# Patient Record
Sex: Female | Born: 1935 | Race: White | Hispanic: No | Marital: Married | Smoking: Never smoker
Health system: Southern US, Community
[De-identification: ages and names within clinical notes are randomized; demographics above are authoritative.]

## PROBLEM LIST (undated history)

## (undated) DIAGNOSIS — E119 Type 2 diabetes mellitus without complications: Secondary | ICD-10-CM

## (undated) DIAGNOSIS — F329 Major depressive disorder, single episode, unspecified: Secondary | ICD-10-CM

## (undated) DIAGNOSIS — Z794 Long term (current) use of insulin: Secondary | ICD-10-CM

## (undated) DIAGNOSIS — N289 Disorder of kidney and ureter, unspecified: Secondary | ICD-10-CM

## (undated) DIAGNOSIS — F32A Depression, unspecified: Secondary | ICD-10-CM

## (undated) DIAGNOSIS — G4730 Sleep apnea, unspecified: Secondary | ICD-10-CM

## (undated) DIAGNOSIS — E785 Hyperlipidemia, unspecified: Secondary | ICD-10-CM

## (undated) DIAGNOSIS — I10 Essential (primary) hypertension: Secondary | ICD-10-CM

## (undated) DIAGNOSIS — C801 Malignant (primary) neoplasm, unspecified: Secondary | ICD-10-CM

## (undated) DIAGNOSIS — IMO0001 Reserved for inherently not codable concepts without codable children: Secondary | ICD-10-CM

## (undated) DIAGNOSIS — I4891 Unspecified atrial fibrillation: Secondary | ICD-10-CM

## (undated) DIAGNOSIS — IMO0002 Reserved for concepts with insufficient information to code with codable children: Secondary | ICD-10-CM

## (undated) HISTORY — DX: Hyperlipidemia, unspecified: E78.5

## (undated) HISTORY — DX: Reserved for inherently not codable concepts without codable children: IMO0001

## (undated) HISTORY — DX: Long term (current) use of insulin: Z79.4

## (undated) HISTORY — DX: Type 2 diabetes mellitus without complications: E11.9

## (undated) HISTORY — DX: Sleep apnea, unspecified: G47.30

## (undated) HISTORY — DX: Malignant (primary) neoplasm, unspecified: C80.1

## (undated) HISTORY — PX: ABDOMINAL HYSTERECTOMY: SHX81

## (undated) HISTORY — PX: JOINT REPLACEMENT: SHX530

## (undated) HISTORY — PX: TONSILLECTOMY: SUR1361

## (undated) HISTORY — DX: Essential (primary) hypertension: I10

## (undated) HISTORY — PX: APPENDECTOMY: SHX54

## (undated) HISTORY — DX: Unspecified atrial fibrillation: I48.91

## (undated) HISTORY — DX: Reserved for concepts with insufficient information to code with codable children: IMO0002

## (undated) HISTORY — DX: Depression, unspecified: F32.A

## (undated) HISTORY — DX: Disorder of kidney and ureter, unspecified: N28.9

## (undated) HISTORY — DX: Major depressive disorder, single episode, unspecified: F32.9

---

## 2005-06-23 HISTORY — PX: CHOLECYSTECTOMY: SHX55

## 2010-06-23 HISTORY — PX: INSERT / REPLACE / REMOVE PACEMAKER: SUR710

## 2010-06-23 HISTORY — PX: INCONTINENCE SURGERY: SHX676

## 2010-06-23 HISTORY — PX: LUNG LOBECTOMY: SHX167

## 2011-02-06 LAB — PULMONARY FUNCTION TEST

## 2011-02-12 DIAGNOSIS — I4891 Unspecified atrial fibrillation: Secondary | ICD-10-CM

## 2011-02-12 DIAGNOSIS — C801 Malignant (primary) neoplasm, unspecified: Secondary | ICD-10-CM | POA: Insufficient documentation

## 2011-02-12 DIAGNOSIS — F329 Major depressive disorder, single episode, unspecified: Secondary | ICD-10-CM

## 2011-02-12 DIAGNOSIS — N289 Disorder of kidney and ureter, unspecified: Secondary | ICD-10-CM | POA: Insufficient documentation

## 2011-02-12 DIAGNOSIS — I10 Essential (primary) hypertension: Secondary | ICD-10-CM

## 2011-02-12 DIAGNOSIS — G4730 Sleep apnea, unspecified: Secondary | ICD-10-CM | POA: Insufficient documentation

## 2011-02-12 DIAGNOSIS — IMO0002 Reserved for concepts with insufficient information to code with codable children: Secondary | ICD-10-CM

## 2011-02-13 ENCOUNTER — Institutional Professional Consult (permissible substitution) (INDEPENDENT_AMBULATORY_CARE_PROVIDER_SITE_OTHER): Payer: Medicare Other | Admitting: Surgery

## 2011-02-13 VITALS — BP 95/63 | HR 68 | Temp 96.9°F | Resp 16 | Ht 63.0 in | Wt 135.0 lb

## 2011-02-13 DIAGNOSIS — C3410 Malignant neoplasm of upper lobe, unspecified bronchus or lung: Secondary | ICD-10-CM

## 2011-02-15 ENCOUNTER — Encounter: Payer: Self-pay | Admitting: Surgery

## 2011-02-15 DIAGNOSIS — IMO0002 Reserved for concepts with insufficient information to code with codable children: Secondary | ICD-10-CM | POA: Insufficient documentation

## 2011-02-15 DIAGNOSIS — N816 Rectocele: Secondary | ICD-10-CM | POA: Insufficient documentation

## 2011-02-15 NOTE — Progress Notes (Signed)
PCP is No primary provider on file. Referring Provider is No ref. provider found  Chief Complaint  Patient presents with  . Lung Cancer    Consultation, Adenocarcinoma, primary right upper lobe of lung    HPI The patient is a 76 year old woman who was scheduled to undergo pelvic floor surgery for cystocele and rectocele by her urologist in [facility]. A preoperative chest x-ray showed a mass in the right upper lobe. Subsequently underwent a CT scan of the chest on 01/13/2011 which showed a 5.5 x 4.6 cm spiculated mass in the right apex. There was also a 1.9 x 1.4 cm right lower lobe mass and there was a 5.7 x 4.9 mm nodule in the left lower lobe. There is also a hypodense lesion within the liver and possible lucencies at T10 and T12 vertebrae. There was also a right thyroid nodule. She was referred to Dr. Dorothyann Gibbs in pulmonary medicine and underwent bronchoscopy with biopsies on 01/29/2011. This showed adenocarcinoma in the right upper lobe lung mass. She underwent a PET scan on 02/05/2011 which showed hypermetabolic activity within the right apical lung mass. There was also hypermetabolic activity within the right lower lobe lung mass with an SUV of 2.85. The low-density lesion in the left lobe of the liver had no hypermetabolic activity. It was felt this is most likely a cyst or hemangioma. There was no other hypermetabolic activity seen within the chest, abdomen or pelvis and none in the spine. An MRI of the brain did not show any evidence of metastatic disease.  Past Medical History  Diagnosis Date  . Depression   . IDDM (insulin dependent diabetes mellitus)   . Hypertension   . Osteoporosis   . Cancer     adenocarcinoma  lung  . Hyperlipidemia   . Renal insufficiency   . Sleep apnea     CPAP  . Atrial fibrillation     ABLATION AT [facility] 2000  . Ulcer     Gastric    Past Surgical History  Procedure Date  . Cholecystectomy 2007  . Appendectomy   . Abdominal hysterectomy     age  69  . Tonsillectomy   . Joint replacement     knee     History reviewed. No pertinent family history.  Social History History  Substance Use Topics  . Smoking status: Never Smoker   . Smokeless tobacco: Never Used  . Alcohol Use: No    Current Outpatient Prescriptions  Medication Sig Dispense Refill  . allopurinol (ZYLOPRIM) 100 MG tablet Take 100 mg by mouth daily.        Marland Kitchen atenolol (TENORMIN) 25 MG tablet Take 25 mg by mouth daily.        . calcium citrate-vitamin D (CITRACAL+D) 315-200 MG-UNIT per tablet Take 1 tablet by mouth 2 (two) times daily.        . colchicine 0.6 MG tablet Take 0.6 mg by mouth 2 (two) times daily.        . digoxin (LANOXIN) 0.125 MG tablet Take 125 mcg by mouth daily.        Marland Kitchen diltiazem (CARDIZEM CD) 360 MG 24 hr capsule Take 360 mg by mouth daily.        Marland Kitchen estradiol (ESTRACE) 1 MG tablet Take 1 mg by mouth daily.        . fentaNYL (DURAGESIC - DOSED MCG/HR) 12 MCG/HR Place 1 patch onto the skin every 3 (three) days.        Marland Kitchen  fentaNYL (DURAGESIC - DOSED MCG/HR) 25 MCG/HR Place 1 patch onto the skin every 3 (three) days.        Marland Kitchen HYDROcodone-acetaminophen (VICODIN) 5-500 MG per tablet Take 1 tablet by mouth 3 (three) times daily.        . Liraglutide (VICTOZA) 18 MG/3ML SOLN Inject into the skin.        Marland Kitchen losartan (COZAAR) 50 MG tablet Take 50 mg by mouth daily.        . metFORMIN (GLUCOPHAGE) 1000 MG tablet Take 1,000 mg by mouth 2 (two) times daily with a meal.        . nystatin (MYCOSTATIN) 100000 UNIT/ML suspension Take 500,000 Units by mouth 4 (four) times daily. 5MLS,swish and swallow       . ondansetron (ZOFRAN) 4 MG tablet Take 4 mg by mouth every 8 (eight) hours as needed.        . pravastatin (PRAVACHOL) 40 MG tablet Take 40 mg by mouth at bedtime.        . torsemide (DEMADEX) 10 MG tablet Take 10 mg by mouth daily.        Marland Kitchen warfarin (COUMADIN) 5 MG tablet Take 5 mg by mouth daily.        Marland Kitchen zolpidem (AMBIEN) 5 MG tablet Take 5 mg by mouth at  bedtime as needed.        . diphenoxylate-atropine (LOMOTIL) 2.5-0.025 MG per tablet Take 1 tablet by mouth 4 (four) times daily as needed.        . fluconazole (DIFLUCAN) 100 MG tablet Take 100 mg by mouth daily.          Allergies  Allergen Reactions  . Adenosine Monophosphate   . Amiodarone Hcl   . Ciprofloxacin Hcl     Renal Insufficiency  . Compazine     Muscle weakness  . Flagyl (Metronidazole Hcl) Itching  . Keflex (Cephalexin)   . Neosporin (Triple Antibiotic)   . Paxil   . Penicillins     MOUTH SORES  . Prinivil (Lisinopril)   . Reglan     MUSCLE WEAKNESS  . Sotalol Hcl Af   . Toprol Xl (Metoprolol Succinate)      Review of Systems  Constitutional: Positive for appetite change and unexpected weight change. Negative for fever, chills, diaphoresis, activity change and fatigue.  HENT: Negative.   Eyes: Negative.   Respiratory: Positive for shortness of breath.   Cardiovascular: Positive for palpitations. Negative for chest pain and leg swelling.  Gastrointestinal: Positive for diarrhea.  Genitourinary: Negative.   Musculoskeletal: Positive for arthralgias.  Skin: Negative.   Neurological: Positive for dizziness.  Hematological: Negative.   Psychiatric/Behavioral: Negative.      Physical Exam  Constitutional: She is oriented to person, place, and time. She appears well-developed and well-nourished.  HENT:  Head: Normocephalic and atraumatic.  Nose: Nose normal.  Mouth/Throat: Oropharynx is clear and moist.  Eyes: Conjunctivae and EOM are normal. Pupils are equal, round, and reactive to light. No scleral icterus.  Neck: Normal range of motion. Neck supple. No tracheal deviation present. No thyromegaly present.  Cardiovascular: Normal heart sounds and intact distal pulses.  Exam reveals no gallop and no friction rub.   No murmur heard.      Irregular rate and rhythm  Pulmonary/Chest: Effort normal and breath sounds normal. She exhibits no tenderness.    Abdominal: Soft. Bowel sounds are normal. She exhibits no distension and no mass. There is no tenderness.  Musculoskeletal: She exhibits no edema.  Lymphadenopathy:    She has no cervical adenopathy.  Neurological: She is alert and oriented to person, place, and time. She has normal strength. No cranial nerve deficit or sensory deficit.  Skin: Skin is warm and dry.  Psychiatric: She has a normal mood and affect. Her behavior is normal. Judgment and thought content normal.    PFT's show normal spirometry and normal lung volumes. FEV1 is 2.52 which is 125% of predicted.  Diffusion capacity is a little low but when corrected for Hgb of 12.3 it is 85% of predicted    Impression She has a right upper lobe adenocarcinoma as well as a smaller 2 x 1.2 cm right lower lobe peripheral mass that has hypermetabolic uptake on PET scan and is likely also a carcinoma. It is not clear if this is a metastatic lesion or a second primary lung cancer. I think the best treatment is to perform a right upper lobectomy and wedge resection of the right lower lobe lung mass.  Plan She will call our office to schedule surgery in the near future. She would like to wait until after the Labor Day holiday. I discussed the operative procedure with the patient and her family. We discussed alternatives, benefits, and risks including but not limited to bleeding, blood transfusion, infection, prolonged air leak, or pleural fistula, and the possibility that she may require adjuvant therapy depending on the intraoperative and pathologic findings. She understands all that and agrees to proceed. She will need to stop her Coumadin 1 week prior to surgery and use a Lovenox bridge.

## 2011-02-21 ENCOUNTER — Encounter (HOSPITAL_COMMUNITY)
Admission: RE | Admit: 2011-02-21 | Discharge: 2011-02-21 | Disposition: A | Discharge status: Home / Self Care | Payer: Medicare Other | Source: Ambulatory Visit | Attending: Surgery | Admitting: Surgery

## 2011-02-21 LAB — COMPREHENSIVE METABOLIC PANEL
AST: 14 U/L (ref 0–37)
Albumin: 3.4 g/dL — ABNORMAL LOW (ref 3.5–5.2)
Alkaline Phosphatase: 88 U/L (ref 39–117)
Chloride: 98 mEq/L (ref 96–112)
Potassium: 4.6 mEq/L (ref 3.5–5.1)
Total Bilirubin: 0.3 mg/dL (ref 0.3–1.2)
Total Protein: 6.7 g/dL (ref 6.0–8.3)

## 2011-02-21 LAB — CBC
MCH: 29 pg (ref 26.0–34.0)
MCV: 85.8 fL (ref 78.0–100.0)
Platelets: 327 10*3/uL (ref 150–400)
RDW: 13.3 % (ref 11.5–15.5)
WBC: 10.2 10*3/uL (ref 4.0–10.5)

## 2011-02-21 LAB — BLOOD GAS, ARTERIAL
Acid-base deficit: 0.8 mmol/L (ref 0.0–2.0)
FIO2: 0.21 %
Patient temperature: 98.6
TCO2: 24.4 mmol/L (ref 0–100)
pH, Arterial: 7.406 — ABNORMAL HIGH (ref 7.350–7.400)

## 2011-02-21 LAB — URINALYSIS, ROUTINE W REFLEX MICROSCOPIC
Bilirubin Urine: NEGATIVE
Glucose, UA: NEGATIVE mg/dL
Hgb urine dipstick: NEGATIVE
Ketones, ur: NEGATIVE mg/dL
pH: 5 (ref 5.0–8.0)

## 2011-02-21 LAB — URINE MICROSCOPIC-ADD ON

## 2011-02-21 LAB — SURGICAL PCR SCREEN: Staphylococcus aureus: POSITIVE — AB

## 2011-02-27 ENCOUNTER — Inpatient Hospital Stay (HOSPITAL_COMMUNITY)
Admission: RE | Admit: 2011-02-27 | Discharge: 2011-03-13 | DRG: 163 | Disposition: A | Discharge status: Rehabilitation Facility | Payer: Medicare Other | Source: Ambulatory Visit | Attending: Surgery | Admitting: Surgery

## 2011-02-27 ENCOUNTER — Other Ambulatory Visit: Payer: Self-pay | Admitting: Surgery

## 2011-02-27 ENCOUNTER — Ambulatory Visit (HOSPITAL_COMMUNITY)
Admission: RE | Admit: 2011-02-27 | Discharge: 2011-02-27 | Disposition: A | Discharge status: Home / Self Care | Payer: Medicare Other | Source: Ambulatory Visit | Attending: Surgery | Admitting: Surgery

## 2011-02-27 ENCOUNTER — Inpatient Hospital Stay (HOSPITAL_COMMUNITY): Payer: Medicare Other

## 2011-02-27 DIAGNOSIS — Z794 Long term (current) use of insulin: Secondary | ICD-10-CM

## 2011-02-27 DIAGNOSIS — I4891 Unspecified atrial fibrillation: Secondary | ICD-10-CM | POA: Diagnosis present

## 2011-02-27 DIAGNOSIS — IMO0002 Reserved for concepts with insufficient information to code with codable children: Secondary | ICD-10-CM

## 2011-02-27 DIAGNOSIS — E119 Type 2 diabetes mellitus without complications: Secondary | ICD-10-CM | POA: Diagnosis present

## 2011-02-27 DIAGNOSIS — J189 Pneumonia, unspecified organism: Secondary | ICD-10-CM | POA: Diagnosis not present

## 2011-02-27 DIAGNOSIS — Z888 Allergy status to other drugs, medicaments and biological substances status: Secondary | ICD-10-CM

## 2011-02-27 DIAGNOSIS — M810 Age-related osteoporosis without current pathological fracture: Secondary | ICD-10-CM | POA: Diagnosis present

## 2011-02-27 DIAGNOSIS — Z881 Allergy status to other antibiotic agents status: Secondary | ICD-10-CM

## 2011-02-27 DIAGNOSIS — E785 Hyperlipidemia, unspecified: Secondary | ICD-10-CM | POA: Diagnosis present

## 2011-02-27 DIAGNOSIS — C3430 Malignant neoplasm of lower lobe, unspecified bronchus or lung: Secondary | ICD-10-CM | POA: Diagnosis present

## 2011-02-27 DIAGNOSIS — R5381 Other malaise: Secondary | ICD-10-CM | POA: Diagnosis present

## 2011-02-27 DIAGNOSIS — E8779 Other fluid overload: Secondary | ICD-10-CM | POA: Diagnosis not present

## 2011-02-27 DIAGNOSIS — Z01812 Encounter for preprocedural laboratory examination: Secondary | ICD-10-CM

## 2011-02-27 DIAGNOSIS — F3289 Other specified depressive episodes: Secondary | ICD-10-CM | POA: Diagnosis present

## 2011-02-27 DIAGNOSIS — K219 Gastro-esophageal reflux disease without esophagitis: Secondary | ICD-10-CM | POA: Diagnosis present

## 2011-02-27 DIAGNOSIS — J9600 Acute respiratory failure, unspecified whether with hypoxia or hypercapnia: Secondary | ICD-10-CM | POA: Diagnosis not present

## 2011-02-27 DIAGNOSIS — D62 Acute posthemorrhagic anemia: Secondary | ICD-10-CM | POA: Diagnosis not present

## 2011-02-27 DIAGNOSIS — F329 Major depressive disorder, single episode, unspecified: Secondary | ICD-10-CM | POA: Diagnosis present

## 2011-02-27 DIAGNOSIS — I10 Essential (primary) hypertension: Secondary | ICD-10-CM | POA: Diagnosis present

## 2011-02-27 DIAGNOSIS — C3410 Malignant neoplasm of upper lobe, unspecified bronchus or lung: Secondary | ICD-10-CM

## 2011-02-27 DIAGNOSIS — Z7901 Long term (current) use of anticoagulants: Secondary | ICD-10-CM

## 2011-02-27 DIAGNOSIS — Z01811 Encounter for preprocedural respiratory examination: Secondary | ICD-10-CM

## 2011-02-27 DIAGNOSIS — Z880 Allergy status to penicillin: Secondary | ICD-10-CM

## 2011-02-27 DIAGNOSIS — G4733 Obstructive sleep apnea (adult) (pediatric): Secondary | ICD-10-CM | POA: Diagnosis present

## 2011-02-27 DIAGNOSIS — Z96659 Presence of unspecified artificial knee joint: Secondary | ICD-10-CM

## 2011-02-27 DIAGNOSIS — Z79899 Other long term (current) drug therapy: Secondary | ICD-10-CM

## 2011-02-27 DIAGNOSIS — R0902 Hypoxemia: Secondary | ICD-10-CM | POA: Diagnosis not present

## 2011-02-27 DIAGNOSIS — E876 Hypokalemia: Secondary | ICD-10-CM | POA: Diagnosis not present

## 2011-02-27 LAB — GLUCOSE, CAPILLARY
Glucose-Capillary: 216 mg/dL — ABNORMAL HIGH (ref 70–99)
Glucose-Capillary: 133 mg/dL — ABNORMAL HIGH (ref 70–99)

## 2011-02-28 ENCOUNTER — Inpatient Hospital Stay (HOSPITAL_COMMUNITY): Payer: Medicare Other

## 2011-02-28 LAB — GLUCOSE, CAPILLARY
Glucose-Capillary: 128 mg/dL — ABNORMAL HIGH (ref 70–99)
Glucose-Capillary: 177 mg/dL — ABNORMAL HIGH (ref 70–99)
Glucose-Capillary: 162 mg/dL — ABNORMAL HIGH (ref 70–99)
Glucose-Capillary: 142 mg/dL — ABNORMAL HIGH (ref 70–99)
Glucose-Capillary: 204 mg/dL — ABNORMAL HIGH (ref 70–99)

## 2011-02-28 LAB — BASIC METABOLIC PANEL
BUN: 11 mg/dL (ref 6–23)
Chloride: 100 mEq/L (ref 96–112)
GFR calc Af Amer: >60 mL/min (ref >60)
GFR calc non Af Amer: >60 mL/min (ref >60)
Potassium: 3.9 mEq/L (ref 3.5–5.1)
Sodium: 133 mEq/L — ABNORMAL LOW (ref 135–145)

## 2011-02-28 LAB — POCT I-STAT 3, ART BLOOD GAS (G3+)
Bicarbonate: 25 mEq/L — ABNORMAL HIGH (ref 20.0–24.0)
Patient temperature: 99
pH, Arterial: 7.399 (ref 7.350–7.400)

## 2011-02-28 LAB — CBC
HCT: 30.1 % — ABNORMAL LOW (ref 36.0–46.0)
MCHC: 33.9 g/dL (ref 30.0–36.0)
Platelets: 216 10*3/uL (ref 150–400)
RDW: 13.7 % (ref 11.5–15.5)
WBC: 14.6 10*3/uL — ABNORMAL HIGH (ref 4.0–10.5)

## 2011-03-01 ENCOUNTER — Inpatient Hospital Stay (HOSPITAL_COMMUNITY): Payer: Medicare Other

## 2011-03-01 LAB — COMPREHENSIVE METABOLIC PANEL
Albumin: 2.2 g/dL — ABNORMAL LOW (ref 3.5–5.2)
BUN: 11 mg/dL (ref 6–23)
Calcium: 8.2 mg/dL — ABNORMAL LOW (ref 8.4–10.5)
Chloride: 101 mEq/L (ref 96–112)
Creatinine, Ser: 0.66 mg/dL (ref 0.50–1.10)
Total Bilirubin: 0.5 mg/dL (ref 0.3–1.2)
Total Protein: 5.4 g/dL — ABNORMAL LOW (ref 6.0–8.3)

## 2011-03-01 LAB — CBC
HCT: 30.7 % — ABNORMAL LOW (ref 36.0–46.0)
MCH: 29.4 pg (ref 26.0–34.0)
MCHC: 33.9 g/dL (ref 30.0–36.0)
MCV: 86.7 fL (ref 78.0–100.0)
RDW: 13.7 % (ref 11.5–15.5)

## 2011-03-01 LAB — GLUCOSE, CAPILLARY
Glucose-Capillary: 101 mg/dL — ABNORMAL HIGH (ref 70–99)
Glucose-Capillary: 125 mg/dL — ABNORMAL HIGH (ref 70–99)
Glucose-Capillary: 183 mg/dL — ABNORMAL HIGH (ref 70–99)

## 2011-03-02 ENCOUNTER — Inpatient Hospital Stay (HOSPITAL_COMMUNITY): Payer: Medicare Other

## 2011-03-02 LAB — BASIC METABOLIC PANEL
CO2: 30 mEq/L (ref 19–32)
Chloride: 102 mEq/L (ref 96–112)
Creatinine, Ser: 0.64 mg/dL (ref 0.50–1.10)
Sodium: 136 mEq/L (ref 135–145)

## 2011-03-02 LAB — GLUCOSE, CAPILLARY
Glucose-Capillary: 108 mg/dL — ABNORMAL HIGH (ref 70–99)
Glucose-Capillary: 109 mg/dL — ABNORMAL HIGH (ref 70–99)
Glucose-Capillary: 190 mg/dL — ABNORMAL HIGH (ref 70–99)

## 2011-03-02 LAB — CBC
MCV: 86.6 fL (ref 78.0–100.0)
Platelets: 186 10*3/uL (ref 150–400)
RBC: 3.28 MIL/uL — ABNORMAL LOW (ref 3.87–5.11)
WBC: 13.8 10*3/uL — ABNORMAL HIGH (ref 4.0–10.5)

## 2011-03-02 LAB — HEPARIN LEVEL (UNFRACTIONATED): Heparin Unfractionated: 0.11 IU/mL — ABNORMAL LOW (ref 0.30–0.70)

## 2011-03-03 ENCOUNTER — Inpatient Hospital Stay (HOSPITAL_COMMUNITY): Payer: Medicare Other

## 2011-03-03 LAB — TYPE AND SCREEN
Antibody Screen: NEGATIVE
Unit division: 0

## 2011-03-03 LAB — GLUCOSE, CAPILLARY
Glucose-Capillary: 88 mg/dL (ref 70–99)
Glucose-Capillary: 204 mg/dL — ABNORMAL HIGH (ref 70–99)
Glucose-Capillary: 161 mg/dL — ABNORMAL HIGH (ref 70–99)

## 2011-03-03 LAB — CBC
Hemoglobin: 9.5 g/dL — ABNORMAL LOW (ref 12.0–15.0)
MCHC: 33.5 g/dL (ref 30.0–36.0)
RBC: 3.29 MIL/uL — ABNORMAL LOW (ref 3.87–5.11)
WBC: 11.3 10*3/uL — ABNORMAL HIGH (ref 4.0–10.5)

## 2011-03-03 LAB — BASIC METABOLIC PANEL
CO2: 33 mEq/L — ABNORMAL HIGH (ref 19–32)
GFR calc non Af Amer: >60 mL/min (ref >60)
Glucose, Bld: 95 mg/dL (ref 70–99)
Potassium: 3.8 mEq/L (ref 3.5–5.1)
Sodium: 136 mEq/L (ref 135–145)

## 2011-03-03 LAB — POCT I-STAT GLUCOSE
Glucose, Bld: 162 mg/dL — ABNORMAL HIGH (ref 70–99)
Operator id: 178832

## 2011-03-03 LAB — HEPARIN LEVEL (UNFRACTIONATED): Heparin Unfractionated: 0.14 IU/mL — ABNORMAL LOW (ref 0.30–0.70)

## 2011-03-03 NOTE — Op Note (Addendum)
[name], [name]:  [id number]  MEDICAL RECORD NO.:  [id number]  LOCATION:  XRAY                         FACILITY:  [facility]  PHYSICIAN:  Evelene Croon, M.D.     DATE OF BIRTH:  1934-07-02  DATE OF PROCEDURE:  02/27/2011 DATE OF DISCHARGE:                              OPERATIVE REPORT   PREOPERATIVE DIAGNOSIS:  Adenocarcinoma of the right upper lobe of the lung with a 1.9 x 1.4 cm right lower lobe mass.  POSTOPERATIVE DIAGNOSIS:  Adenocarcinoma of the right upper lobe of the lung with a 1.9 x 1.4 cm right lower lobe mass.  PROCEDURES: 1. Flexible fiberoptic bronchoscopy. 2. Right thoracotomy with right upper lobectomy and wedge resection of     right lower lobe lung mass.  Insertion of On-Q pain pump.  ATTENDING SURGEON:  Evelene Croon, MD  ASSISTANT:  Gershon Crane, PA-C  ANESTHESIA:  General endotracheal.  CLINICAL HISTORY:  This patient is a 76 year old woman who was undergoing preoperative workup to undergo pelvic floor surgery for a cystocele and rectocele by her urologist in [facility].  A preoperative chest x-ray showed a mass in the right upper lobe.  CT scan of the chest on January 13, 2011 showed a 5.5 x 4.6 cm spiculated mass in the right apex.  There is also a 1.9 x 1.4 cm right lower lobe mass located posteriorly and peripherally.  There was a 5.7 x 4.9 mm nodule in the left lower lobe.  There is also hypodense lesion within the liver and possible lucencies at T10 and T12 vertebrae.  There is also a right thyroid nodule.  She was referred to Dr. Chales Abrahams in Pulmonary Medicine and underwent bronchoscopy with biopsies on January 29, 2011.  Biopsy of the right upper lobe showed adenocarcinoma.  She underwent a PET scan on February 05, 2011 that showed hypermetabolic activity within the right apical lung mass as well as within the right lower lobe lung mass with an SUV of 2.85.  The low-density lesion in the left lower lobe had no hypermetabolic  activity and was felt to most likely be a cyst or hemangioma.  There is no other hypermetabolic activity seen in the chest, abdomen, or pelvis and none in the spine.  MRI of the brain did not show any evidence of metastatic disease.  She was referred to my office and after reviewing her films and examination of the patient, I felt the best treatment would be to proceed with a bronchoscopy followed by right thoracotomy with right upper lobectomy and wedge resection of the right lower lobe lung mass.  It was not clear that this was definitely a cancer, but I suspected it probably was given its appearance and hypermetabolic activity on PET scan.  It is not certain that this is a second primary lung cancer or a cancer related to the right upper lobe adenocarcinoma.  Preoperative pulmonary function testing showed an FEV-1 of 2.52 and diffusion capacity that was mildly decreased.  I felt that she would tolerate a right upper lobectomy and wedge resection.  Given her age of 76 years, I really did not want to perform upper and lower lobectomies or pneumonectomy.  I discussed the  operative procedure and plans with the patient and her family.  We discussed alternatives, benefits, and risks including but not limited to bleeding, blood transfusion, infection, prolonged air leak, bronchopleural fistula, inability to completely resect all of the cancer, and the possibility that she could have lymph node metastasis. She understood all this and agreed to proceed.  OPERATIVE PROCEDURE:  The patient was seen in the preoperative holding area and the proper patient, proper operation, proper operative side were confirmed with the patient and her CT scan and PET scan were reviewed.  The right side of chest was signed by me.  Preoperative intravenous vancomycin was given.  We did not give her Zinacef due to cephalosporin allergy as well as penicillin allergy.  She was taken back to the operating room and  placed on the table in supine position.  After induction of general endotracheal anesthesia using a fixed single-lumen tube, a Foley catheter was placed in the bladder using sterile technique and lower extremity pneumatic compression devices were placed.  Then flexible fiberoptic bronchoscopy was performed with the video bronchoscope.  The distal trachea appeared normal.  The carina was sharp.  The left bronchial tree had normal segmental anatomy without endobronchial lesions or extrinsic compression.  The right mainstem bronchus appeared normal.  The right upper lobe segmental bronchi were all visualized and no abnormality was seen.  The bronchus intermedius and middle and lower lobe bronchi were all examined and appeared unremarkable.  The bronchoscope was withdrawn from the patient.  Then the single-lumen tube was switched to a double-lumen tube by Anesthesiology.  The patient was positioned in the left lateral decubitus position with the right side up.  The right side of the chest was prepped with Betadine soap and solution and draped in the usual sterile manner.  Time-out was taken, proper patient, proper operation, proper operative side were confirmed with nursing and anesthesia staff.  Then a lateral muscle sparing thoracotomy incision was performed.  The pleural space was entered through the fifth intercostal space which I felt would give me a better exposure to the lower lobe lesion. Examination of pleural space showed no pleural abnormalities.  The mass in the apex of the right upper lobe was clearly visible and palpable and was fairly large.  The mass in the right lower lobe was visible with umbilication of the overlying visceral pleura.  No other lung masses were identified.  There were well-defined fissures between the upper, middle, and lower lobes.  The phrenic nerve was identified and carefully avoided.  Then the mediastinal pleura was opened over the hilar vessels. The  right upper lobe pulmonary arteries were identified and carefully encircled with tapes and then divided using the Autosuture vascular staples.  Then the right upper lobe pulmonary vein was identified.  The lowermost branch of the upper pulmonary vein was clearly going to the middle lobe and this was preserved.  The right upper lobe pulmonary vein branches were encircled with a tape and then ligated using a vascular stapler and divided.  Then the major fissure was opened and the interlobar pulmonary artery identified.  The pulmonary arterial branches to the lower lobe were identified and preserved.  There was only one arterial branch to the middle lobe and this was preserved.  The posterior ascending branch of the pulmonary artery had already been divided.  Then the fissure between the upper and middle lobe was divided using a stapler.  The remainder of the fissure between upper and lower lobe posteriorly was  divided using a stapler.  Then the lung was distracted anteriorly and the posterior mediastinal pleura was opened over the right main bronchus.  The right upper lobe bronchus was identified.  There were a couple lymph nodes around the bronchus and these were taken with specimen.  After mobilizing the bronchus, it was encircled with a tape and then divided using a TL-30 linear stapler. The stapler was tightened and then the right lung was reinflated to make sure the middle and lower lobes were fully inflated.  Then the lung was deflated and the stapler fired.  The right upper lobe bronchus was divided distal to the stapler.  The specimen was then passed off the table.  There were few other lymph nodes that were visible and these were sent to pathology separately.  There were lymph nodes from stations 10R, 9R, and 11.  Then the inferior pulmonary ligament was divided up to the inferior pulmonary vein.  The chest was filled with warm saline solution and the right lung inflated to a  pressure of 30 cm of water and the bronchial stump appeared free of any air leaks.  Then the lung was deflated.  Then examination of the lower lobe was performed.  A wedge resection of the right lower lobe lung mass was performed using linear staplers, making sure that there was a wide grossly negative margin around the mass.  I took as large of the wedge resection as I could. The specimen was sent to pathology separately.  Then the staple lines were all coated with ProGEL to stop any air leaks.  The middle lobe was then pexed to the lower lobe to prevent rotation.  Then a On-Q pain catheter was inserted through a separate stab incision. A peel-away sheath and introducer were first inserted and directed a subpleural location from below the incisional intercostal space posteriorly up about 2 interspaces above the incision.  The introducer was removed and catheter inserted.  The peel-away sheath was removed. The catheter was fixed to the skin with silk suture and flushed.  It was connected to pain ball with 0.25% Marcaine.  Then two #28 French chest tubes were brought through separate stab incision, one positioned posteriorly and one anteriorly.  There was complete hemostasis.  The lung was reinflated.  Then the ribs were reapproximated with #2 Vicryl pericostal sutures.  The muscles were returned to normal anatomic position.  Subcutaneous tissue was closed with continuous 2-0 Vicryl and skin with 3-0 Vicryl subcuticular closure.  The sponge, needle, and instrument counts were correct according to the scrub nurse.  A dry sterile dressing was applied over the incision and around the chest tubes which were to Pleur-Evac suction.  The patient was then turned in the supine position, extubated, transferred to the Postanesthesia Care Unit in satisfactory and stable condition.     Evelene Croon, M.D.   ______________________________ Evelene Croon, M.D.    BB/MEDQ  D:  02/27/2011  T:   02/27/2011  Job:  [id number]  cc:   Vanessa Ralphs, MD  Electronically Signed by Evelene Croon M.D. on 03/20/2011 04:29:04 PM

## 2011-03-04 ENCOUNTER — Inpatient Hospital Stay (HOSPITAL_COMMUNITY): Payer: Medicare Other

## 2011-03-04 LAB — GLUCOSE, CAPILLARY
Glucose-Capillary: 142 mg/dL — ABNORMAL HIGH (ref 70–99)
Glucose-Capillary: 127 mg/dL — ABNORMAL HIGH (ref 70–99)

## 2011-03-04 LAB — BASIC METABOLIC PANEL
CO2: 31 mEq/L (ref 19–32)
Chloride: 96 mEq/L (ref 96–112)
Glucose, Bld: 99 mg/dL (ref 70–99)
Potassium: 3.4 mEq/L — ABNORMAL LOW (ref 3.5–5.1)
Sodium: 133 mEq/L — ABNORMAL LOW (ref 135–145)

## 2011-03-04 LAB — CBC
Hemoglobin: 8.9 g/dL — ABNORMAL LOW (ref 12.0–15.0)
RBC: 3.12 MIL/uL — ABNORMAL LOW (ref 3.87–5.11)
WBC: 9.4 10*3/uL (ref 4.0–10.5)

## 2011-03-04 LAB — ANTITHROMBIN III: AntiThromb III Func: 65 % — ABNORMAL LOW (ref 75–120)

## 2011-03-04 LAB — CULTURE, RESPIRATORY W GRAM STAIN

## 2011-03-04 LAB — HEPARIN LEVEL (UNFRACTIONATED): Heparin Unfractionated: 0.18 IU/mL — ABNORMAL LOW (ref 0.30–0.70)

## 2011-03-05 ENCOUNTER — Inpatient Hospital Stay (HOSPITAL_COMMUNITY): Payer: Medicare Other

## 2011-03-05 LAB — POCT I-STAT 3, ART BLOOD GAS (G3+)
Bicarbonate: 25.9 mEq/L — ABNORMAL HIGH (ref 20.0–24.0)
TCO2: 27 mmol/L (ref 0–100)
pCO2 arterial: 37.1 mmHg (ref 35.0–45.0)
pH, Arterial: 7.452 — ABNORMAL HIGH (ref 7.350–7.400)
pO2, Arterial: 51 mmHg — ABNORMAL LOW (ref 80.0–100.0)

## 2011-03-05 LAB — CBC
HCT: 28.1 % — ABNORMAL LOW (ref 36.0–46.0)
MCV: 85.7 fL (ref 78.0–100.0)
RBC: 3.28 MIL/uL — ABNORMAL LOW (ref 3.87–5.11)
WBC: 12.5 10*3/uL — ABNORMAL HIGH (ref 4.0–10.5)

## 2011-03-05 LAB — GLUCOSE, CAPILLARY
Glucose-Capillary: 127 mg/dL — ABNORMAL HIGH (ref 70–99)
Glucose-Capillary: 184 mg/dL — ABNORMAL HIGH (ref 70–99)
Glucose-Capillary: 154 mg/dL — ABNORMAL HIGH (ref 70–99)

## 2011-03-05 LAB — BASIC METABOLIC PANEL
CO2: 29 mEq/L (ref 19–32)
Calcium: 8.5 mg/dL (ref 8.4–10.5)
Chloride: 94 mEq/L — ABNORMAL LOW (ref 96–112)
Creatinine, Ser: 0.55 mg/dL (ref 0.50–1.10)
Glucose, Bld: 304 mg/dL — ABNORMAL HIGH (ref 70–99)
Sodium: 130 mEq/L — ABNORMAL LOW (ref 135–145)

## 2011-03-05 LAB — PRO B NATRIURETIC PEPTIDE: Pro B Natriuretic peptide (BNP): 1819 pg/mL — ABNORMAL HIGH (ref 0–125)

## 2011-03-05 LAB — SEDIMENTATION RATE: Sed Rate: 98 mm/hr — ABNORMAL HIGH (ref 0–22)

## 2011-03-06 ENCOUNTER — Inpatient Hospital Stay (HOSPITAL_COMMUNITY): Payer: Medicare Other

## 2011-03-06 DIAGNOSIS — J95821 Acute postprocedural respiratory failure: Secondary | ICD-10-CM

## 2011-03-06 DIAGNOSIS — J13 Pneumonia due to Streptococcus pneumoniae: Secondary | ICD-10-CM

## 2011-03-06 DIAGNOSIS — J679 Hypersensitivity pneumonitis due to unspecified organic dust: Secondary | ICD-10-CM

## 2011-03-06 DIAGNOSIS — R0902 Hypoxemia: Secondary | ICD-10-CM

## 2011-03-06 LAB — BASIC METABOLIC PANEL
BUN: 15 mg/dL (ref 6–23)
CO2: 31 mEq/L (ref 19–32)
GFR calc non Af Amer: >60 mL/min (ref >60)
Glucose, Bld: 273 mg/dL — ABNORMAL HIGH (ref 70–99)
Potassium: 4.1 mEq/L (ref 3.5–5.1)
Sodium: 133 mEq/L — ABNORMAL LOW (ref 135–145)

## 2011-03-06 LAB — GLUCOSE, CAPILLARY
Glucose-Capillary: 294 mg/dL — ABNORMAL HIGH (ref 70–99)
Glucose-Capillary: 282 mg/dL — ABNORMAL HIGH (ref 70–99)
Glucose-Capillary: 304 mg/dL — ABNORMAL HIGH (ref 70–99)

## 2011-03-06 LAB — CBC
HCT: 28.8 % — ABNORMAL LOW (ref 36.0–46.0)
Hemoglobin: 9.6 g/dL — ABNORMAL LOW (ref 12.0–15.0)
MCHC: 33.3 g/dL (ref 30.0–36.0)
RBC: 3.38 MIL/uL — ABNORMAL LOW (ref 3.87–5.11)

## 2011-03-06 LAB — VANCOMYCIN, TROUGH: Vancomycin Tr: 8.5 ug/mL — ABNORMAL LOW (ref 10.0–20.0)

## 2011-03-06 LAB — HEPARIN LEVEL (UNFRACTIONATED): Heparin Unfractionated: 0.39 IU/mL (ref 0.30–0.70)

## 2011-03-06 LAB — PROCALCITONIN: Procalcitonin: 0.27 ng/mL

## 2011-03-07 ENCOUNTER — Inpatient Hospital Stay (HOSPITAL_COMMUNITY): Payer: Medicare Other

## 2011-03-07 LAB — GLUCOSE, CAPILLARY: Glucose-Capillary: 134 mg/dL — ABNORMAL HIGH (ref 70–99)

## 2011-03-07 LAB — BASIC METABOLIC PANEL
CO2: 29 mEq/L (ref 19–32)
Calcium: 9.3 mg/dL (ref 8.4–10.5)
Creatinine, Ser: 0.55 mg/dL (ref 0.50–1.10)
GFR calc non Af Amer: >60 mL/min (ref >60)
Glucose, Bld: 125 mg/dL — ABNORMAL HIGH (ref 70–99)
Sodium: 136 mEq/L (ref 135–145)

## 2011-03-07 LAB — HEPARIN LEVEL (UNFRACTIONATED): Heparin Unfractionated: 0.89 IU/mL — ABNORMAL HIGH (ref 0.30–0.70)

## 2011-03-07 LAB — CBC
MCV: 85.8 fL (ref 78.0–100.0)
Platelets: 335 10*3/uL (ref 150–400)
RBC: 3.52 MIL/uL — ABNORMAL LOW (ref 3.87–5.11)
RDW: 13.8 % (ref 11.5–15.5)
WBC: 14.5 10*3/uL — ABNORMAL HIGH (ref 4.0–10.5)

## 2011-03-07 LAB — PROTIME-INR: INR: 1.13 (ref 0.00–1.49)

## 2011-03-07 LAB — VANCOMYCIN, TROUGH: Vancomycin Tr: 8.8 ug/mL — ABNORMAL LOW (ref 10.0–20.0)

## 2011-03-08 ENCOUNTER — Inpatient Hospital Stay (HOSPITAL_COMMUNITY): Payer: Medicare Other

## 2011-03-08 LAB — GLUCOSE, CAPILLARY
Glucose-Capillary: 209 mg/dL — ABNORMAL HIGH (ref 70–99)
Glucose-Capillary: 124 mg/dL — ABNORMAL HIGH (ref 70–99)
Glucose-Capillary: 235 mg/dL — ABNORMAL HIGH (ref 70–99)

## 2011-03-08 LAB — CBC
HCT: 27.9 % — ABNORMAL LOW (ref 36.0–46.0)
Hemoglobin: 9.4 g/dL — ABNORMAL LOW (ref 12.0–15.0)
MCH: 28.8 pg (ref 26.0–34.0)
MCHC: 33.7 g/dL (ref 30.0–36.0)
MCV: 85.6 fL (ref 78.0–100.0)
Platelets: 340 10*3/uL (ref 150–400)
RBC: 3.26 MIL/uL — ABNORMAL LOW (ref 3.87–5.11)
RDW: 13.8 % (ref 11.5–15.5)
WBC: 10.3 10*3/uL (ref 4.0–10.5)

## 2011-03-08 LAB — BASIC METABOLIC PANEL
BUN: 23 mg/dL (ref 6–23)
CO2: 28 mEq/L (ref 19–32)
Calcium: 8.6 mg/dL (ref 8.4–10.5)
Chloride: 99 mEq/L (ref 96–112)
Creatinine, Ser: 0.47 mg/dL — ABNORMAL LOW (ref 0.50–1.10)
GFR calc Af Amer: >60 mL/min (ref >60)
GFR calc non Af Amer: >60 mL/min (ref >60)
Glucose, Bld: 155 mg/dL — ABNORMAL HIGH (ref 70–99)
Potassium: 3.6 mEq/L (ref 3.5–5.1)
Sodium: 135 mEq/L (ref 135–145)

## 2011-03-08 LAB — HEPARIN LEVEL (UNFRACTIONATED): Heparin Unfractionated: 0.51 IU/mL (ref 0.30–0.70)

## 2011-03-08 LAB — PROTIME-INR
INR: 1.22 (ref 0.00–1.49)
Prothrombin Time: 15.7 seconds — ABNORMAL HIGH (ref 11.6–15.2)

## 2011-03-09 ENCOUNTER — Inpatient Hospital Stay (HOSPITAL_COMMUNITY): Payer: Medicare Other

## 2011-03-09 LAB — BASIC METABOLIC PANEL
BUN: 18 mg/dL (ref 6–23)
CO2: 31 mEq/L (ref 19–32)
Calcium: 8.9 mg/dL (ref 8.4–10.5)
Chloride: 100 mEq/L (ref 96–112)
Creatinine, Ser: <0.47 mg/dL — ABNORMAL LOW (ref 0.50–1.10)
Glucose, Bld: 123 mg/dL — ABNORMAL HIGH (ref 70–99)
Potassium: 3.7 mEq/L (ref 3.5–5.1)
Sodium: 137 mEq/L (ref 135–145)

## 2011-03-09 LAB — CBC
HCT: 31.5 % — ABNORMAL LOW (ref 36.0–46.0)
Hemoglobin: 10.7 g/dL — ABNORMAL LOW (ref 12.0–15.0)
MCH: 28.8 pg (ref 26.0–34.0)
MCHC: 34 g/dL (ref 30.0–36.0)
MCV: 84.7 fL (ref 78.0–100.0)
Platelets: 369 10*3/uL (ref 150–400)
RBC: 3.72 MIL/uL — ABNORMAL LOW (ref 3.87–5.11)
RDW: 13.9 % (ref 11.5–15.5)
WBC: 12.2 10*3/uL — ABNORMAL HIGH (ref 4.0–10.5)

## 2011-03-09 LAB — GLUCOSE, CAPILLARY
Glucose-Capillary: 139 mg/dL — ABNORMAL HIGH (ref 70–99)
Glucose-Capillary: 156 mg/dL — ABNORMAL HIGH (ref 70–99)
Glucose-Capillary: 171 mg/dL — ABNORMAL HIGH (ref 70–99)

## 2011-03-09 LAB — PROTIME-INR
INR: 1.44 (ref 0.00–1.49)
Prothrombin Time: 17.8 seconds — ABNORMAL HIGH (ref 11.6–15.2)

## 2011-03-09 LAB — HEPARIN LEVEL (UNFRACTIONATED): Heparin Unfractionated: 0.94 IU/mL — ABNORMAL HIGH (ref 0.30–0.70)

## 2011-03-10 ENCOUNTER — Inpatient Hospital Stay (HOSPITAL_COMMUNITY): Payer: Medicare Other

## 2011-03-10 LAB — GLUCOSE, CAPILLARY
Glucose-Capillary: 101 mg/dL — ABNORMAL HIGH (ref 70–99)
Glucose-Capillary: 157 mg/dL — ABNORMAL HIGH (ref 70–99)
Glucose-Capillary: 169 mg/dL — ABNORMAL HIGH (ref 70–99)

## 2011-03-10 LAB — PROTIME-INR: Prothrombin Time: 17.3 seconds — ABNORMAL HIGH (ref 11.6–15.2)

## 2011-03-11 ENCOUNTER — Inpatient Hospital Stay (HOSPITAL_COMMUNITY): Payer: Medicare Other

## 2011-03-11 DIAGNOSIS — C3490 Malignant neoplasm of unspecified part of unspecified bronchus or lung: Secondary | ICD-10-CM

## 2011-03-11 DIAGNOSIS — R5381 Other malaise: Secondary | ICD-10-CM

## 2011-03-11 LAB — CBC
MCH: 28.1 pg (ref 26.0–34.0)
MCHC: 33.2 g/dL (ref 30.0–36.0)
Platelets: 489 10*3/uL — ABNORMAL HIGH (ref 150–400)
RBC: 4.37 MIL/uL (ref 3.87–5.11)
RDW: 14.3 % (ref 11.5–15.5)

## 2011-03-11 LAB — HEPARIN LEVEL (UNFRACTIONATED): Heparin Unfractionated: 0.72 IU/mL — ABNORMAL HIGH (ref 0.30–0.70)

## 2011-03-11 LAB — GLUCOSE, CAPILLARY: Glucose-Capillary: 204 mg/dL — ABNORMAL HIGH (ref 70–99)

## 2011-03-11 LAB — PROTIME-INR: Prothrombin Time: 18.7 seconds — ABNORMAL HIGH (ref 11.6–15.2)

## 2011-03-12 LAB — GLUCOSE, CAPILLARY: Glucose-Capillary: 199 mg/dL — ABNORMAL HIGH (ref 70–99)

## 2011-03-12 LAB — CBC
Hemoglobin: 11.4 g/dL — ABNORMAL LOW (ref 12.0–15.0)
MCH: 28.6 pg (ref 26.0–34.0)
RBC: 3.99 MIL/uL (ref 3.87–5.11)

## 2011-03-12 LAB — HEPARIN LEVEL (UNFRACTIONATED): Heparin Unfractionated: 0.72 IU/mL — ABNORMAL HIGH (ref 0.30–0.70)

## 2011-03-12 LAB — DIGOXIN LEVEL: Digoxin Level: 0.5 ng/mL — ABNORMAL LOW (ref 0.8–2.0)

## 2011-03-13 ENCOUNTER — Inpatient Hospital Stay (HOSPITAL_COMMUNITY)
Admission: RE | Admit: 2011-03-13 | Discharge: 2011-03-22 | DRG: 945 | Disposition: A | Discharge status: Home Health | Payer: Medicare Other | Source: Other Acute Inpatient Hospital | Attending: Physical Medicine & Rehabilitation | Admitting: Physical Medicine & Rehabilitation

## 2011-03-13 DIAGNOSIS — R0902 Hypoxemia: Secondary | ICD-10-CM

## 2011-03-13 DIAGNOSIS — Z5189 Encounter for other specified aftercare: Secondary | ICD-10-CM

## 2011-03-13 DIAGNOSIS — Z79899 Other long term (current) drug therapy: Secondary | ICD-10-CM

## 2011-03-13 DIAGNOSIS — M545 Low back pain, unspecified: Secondary | ICD-10-CM

## 2011-03-13 DIAGNOSIS — C3410 Malignant neoplasm of upper lobe, unspecified bronchus or lung: Secondary | ICD-10-CM

## 2011-03-13 DIAGNOSIS — R5381 Other malaise: Secondary | ICD-10-CM

## 2011-03-13 DIAGNOSIS — J189 Pneumonia, unspecified organism: Secondary | ICD-10-CM

## 2011-03-13 DIAGNOSIS — I129 Hypertensive chronic kidney disease with stage 1 through stage 4 chronic kidney disease, or unspecified chronic kidney disease: Secondary | ICD-10-CM

## 2011-03-13 DIAGNOSIS — Z9889 Other specified postprocedural states: Secondary | ICD-10-CM

## 2011-03-13 DIAGNOSIS — M1710 Unilateral primary osteoarthritis, unspecified knee: Secondary | ICD-10-CM

## 2011-03-13 DIAGNOSIS — G8929 Other chronic pain: Secondary | ICD-10-CM

## 2011-03-13 DIAGNOSIS — J438 Other emphysema: Secondary | ICD-10-CM

## 2011-03-13 DIAGNOSIS — S8290XS Unspecified fracture of unspecified lower leg, sequela: Secondary | ICD-10-CM

## 2011-03-13 DIAGNOSIS — G4733 Obstructive sleep apnea (adult) (pediatric): Secondary | ICD-10-CM

## 2011-03-13 DIAGNOSIS — G609 Hereditary and idiopathic neuropathy, unspecified: Secondary | ICD-10-CM

## 2011-03-13 DIAGNOSIS — K279 Peptic ulcer, site unspecified, unspecified as acute or chronic, without hemorrhage or perforation: Secondary | ICD-10-CM

## 2011-03-13 DIAGNOSIS — Z7901 Long term (current) use of anticoagulants: Secondary | ICD-10-CM

## 2011-03-13 DIAGNOSIS — M109 Gout, unspecified: Secondary | ICD-10-CM

## 2011-03-13 DIAGNOSIS — Z96659 Presence of unspecified artificial knee joint: Secondary | ICD-10-CM

## 2011-03-13 DIAGNOSIS — E119 Type 2 diabetes mellitus without complications: Secondary | ICD-10-CM

## 2011-03-13 DIAGNOSIS — X58XXXS Exposure to other specified factors, sequela: Secondary | ICD-10-CM

## 2011-03-13 DIAGNOSIS — M24573 Contracture, unspecified ankle: Secondary | ICD-10-CM

## 2011-03-13 DIAGNOSIS — N189 Chronic kidney disease, unspecified: Secondary | ICD-10-CM

## 2011-03-13 DIAGNOSIS — R32 Unspecified urinary incontinence: Secondary | ICD-10-CM

## 2011-03-13 DIAGNOSIS — I4891 Unspecified atrial fibrillation: Secondary | ICD-10-CM

## 2011-03-13 DIAGNOSIS — F341 Dysthymic disorder: Secondary | ICD-10-CM

## 2011-03-13 DIAGNOSIS — N8111 Cystocele, midline: Secondary | ICD-10-CM

## 2011-03-13 DIAGNOSIS — N816 Rectocele: Secondary | ICD-10-CM

## 2011-03-13 LAB — CBC
HCT: 34.9 % — ABNORMAL LOW (ref 36.0–46.0)
MCHC: 33.8 g/dL (ref 30.0–36.0)
MCV: 85.7 fL (ref 78.0–100.0)
RDW: 14.5 % (ref 11.5–15.5)

## 2011-03-13 LAB — GLUCOSE, CAPILLARY
Glucose-Capillary: 123 mg/dL — ABNORMAL HIGH (ref 70–99)
Glucose-Capillary: 111 mg/dL — ABNORMAL HIGH (ref 70–99)

## 2011-03-13 LAB — PROTIME-INR: Prothrombin Time: 33.4 seconds — ABNORMAL HIGH (ref 11.6–15.2)

## 2011-03-14 DIAGNOSIS — R5381 Other malaise: Secondary | ICD-10-CM

## 2011-03-14 LAB — URINALYSIS, ROUTINE W REFLEX MICROSCOPIC
Leukocytes, UA: NEGATIVE
Nitrite: NEGATIVE
Protein, ur: NEGATIVE mg/dL
Urobilinogen, UA: 1 mg/dL (ref 0.0–1.0)

## 2011-03-14 LAB — COMPREHENSIVE METABOLIC PANEL
AST: 14 U/L (ref 0–37)
BUN: 17 mg/dL (ref 6–23)
CO2: 33 mEq/L — ABNORMAL HIGH (ref 19–32)
Calcium: 9.3 mg/dL (ref 8.4–10.5)
Creatinine, Ser: 0.56 mg/dL (ref 0.50–1.10)
GFR calc non Af Amer: >60 mL/min (ref >60)

## 2011-03-14 LAB — DIFFERENTIAL
Basophils Absolute: 0 10*3/uL (ref 0.0–0.1)
Basophils Relative: 0 % (ref 0–1)
Eosinophils Relative: 0 % (ref 0–5)
Monocytes Absolute: 1.1 10*3/uL — ABNORMAL HIGH (ref 0.1–1.0)

## 2011-03-14 LAB — GLUCOSE, CAPILLARY
Glucose-Capillary: 83 mg/dL (ref 70–99)
Glucose-Capillary: 231 mg/dL — ABNORMAL HIGH (ref 70–99)
Glucose-Capillary: 196 mg/dL — ABNORMAL HIGH (ref 70–99)

## 2011-03-14 LAB — CBC
MCH: 28.7 pg (ref 26.0–34.0)
MCHC: 33.3 g/dL (ref 30.0–36.0)
RDW: 14.5 % (ref 11.5–15.5)

## 2011-03-14 LAB — PROTIME-INR
INR: 2.6 — ABNORMAL HIGH (ref 0.00–1.49)
Prothrombin Time: 28.3 seconds — ABNORMAL HIGH (ref 11.6–15.2)

## 2011-03-15 DIAGNOSIS — R5381 Other malaise: Secondary | ICD-10-CM

## 2011-03-15 LAB — GLUCOSE, CAPILLARY

## 2011-03-15 LAB — URINE CULTURE: Colony Count: 5000

## 2011-03-16 LAB — GLUCOSE, CAPILLARY
Glucose-Capillary: 100 mg/dL — ABNORMAL HIGH (ref 70–99)
Glucose-Capillary: 187 mg/dL — ABNORMAL HIGH (ref 70–99)
Glucose-Capillary: 138 mg/dL — ABNORMAL HIGH (ref 70–99)

## 2011-03-16 LAB — PROTIME-INR
INR: 2.44 — ABNORMAL HIGH (ref 0.00–1.49)
Prothrombin Time: 26.9 seconds — ABNORMAL HIGH (ref 11.6–15.2)

## 2011-03-17 LAB — PROTIME-INR: INR: 3.06 — ABNORMAL HIGH (ref 0.00–1.49)

## 2011-03-17 LAB — GLUCOSE, CAPILLARY
Glucose-Capillary: 89 mg/dL (ref 70–99)
Glucose-Capillary: 211 mg/dL — ABNORMAL HIGH (ref 70–99)

## 2011-03-18 LAB — GLUCOSE, CAPILLARY
Glucose-Capillary: 87 mg/dL (ref 70–99)
Glucose-Capillary: 125 mg/dL — ABNORMAL HIGH (ref 70–99)

## 2011-03-18 LAB — PROTIME-INR: INR: 2.53 — ABNORMAL HIGH (ref 0.00–1.49)

## 2011-03-18 NOTE — H&P (Addendum)
NAMEPARMINDER, TRAPANI NO.:  [id number]  MEDICAL RECORD NO.:  [id number]  LOCATION:  4008                         FACILITY:  [facility]  PHYSICIAN:  Ranelle Oyster, M.D.DATE OF BIRTH:  1935-06-15  DATE OF ADMISSION:  03/13/2011 DATE OF DISCHARGE:                             HISTORY & PHYSICAL   REASON FOR ADMISSION:  Deconditioning.  HISTORY OF PRESENT ILLNESS:  A 76 year old female with prior history of diabetes, hypertension, left ankle fracture, and contracture.  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  She was admitted on February 27, 2011, for right thoracotomy, right upper lobe lobectomy and wedge resection right lower lobe mass by Dr. Rexanne Mano. Postoperatively, the patient had left upper lung consolidation due to pneumonia.  She was started on IV antibiotics and Solu-Medrol for hypoxia.  Postoperatively was placed on IV heparin, chronic Coumadin was resumed on March 06, 2011, for diagnosis of atrial fibrillation. PCCM consult to recommend 10-taper of steroids, discontinue of Primaxin on March 09, 2011, wean O2 for O2 sats greater than 92%.  The patient has tachycardia with activity up to 130s, then she rests.  REVIEW OF SYSTEMS:  Positive for urinary incontinence.  Positive for chronic diarrhea.  Left ankle decreased range of motion and chronic pain, on narcotic analgesics at home.  Chronic low back pain, insomnia, anxiety, and depression on review of systems.  PAST MEDICAL HISTORY: 1. Diabetes. 2. Hypertension. 3. CRI. 4. OSA. 5. PUD. 6. AFib. 7. Gout. 8. Rectocele and cystocele.  PAST SURGICAL HISTORY:  Cholecystectomy; hysterectomy; left ankle fracture, status post ORIF; left TKR; atrial ablation for AFib.  FAMILY HISTORY:  Bronchiectasis, CVA, and CAD.  SOCIAL HISTORY:  Married, lives in a trilevel home, two steps to enter. No  tobacco or occasional EtOH.  FUNCTIONAL HISTORY:  Independent and active prior to admission.  HOME MEDICATIONS: 1. Pravastatin. 2. Metformin. 3. Atenolol. 4. Zofran. 5. Losartan. 6. Coumadin. 7. Lovenox. 8. Lomotil. 9. Demadex. 10.Estradiol. 11.Allopurinol. 12.Fentanyl patch 25 mcg. 13.Colchicine 0.6 b.i.d.  ALLERGIES: 1. ADENOSINE. 2. KEFLEX. 3. CIPRO. 4. PRINIVIL. 5. COMPAZINE. 6. TOPROL-XL. 7. FLAGYL. 8. PAXIL. 9. PENICILLIN. 10.SOTALOL. 11.NEOSPORIN. 12.AMIODARONE.  Recent laboratory data include sed rate 98.  White count on March 13, 2011 was 14.4, hemoglobin 11.8.  Chest x-ray on March 11, 2011, left mid lung opacity without changes.  PHYSICAL EXAMINATION:  VITAL SIGNS:  Blood pressure 166/88, pulse 76, respirations 18,  temperature 98.0. GENERAL:  A well-developed, well-nourished elderly female in no acute stress. HEENT:  Eyes; anicteric, noninjected.  External ENT normal. NECK:  Supple without adenopathy.  Respirator effort is good. LUNGS:  Clear. HEART:  Irregularly irregular.  No murmurs. EXTREMITIES:  Without edema.  Pedal pulses are normal bilaterally. ABDOMEN:  Positive bowel sounds, soft, nontender to palpation. NEUROLOGIC:  Motor strength is 4/5 bilateral deltoid, biceps, triceps, finger flexors, hip flexor, quad, TA and gastroc.  Mood, memory, and affect are all intact.  Cranial nerves II through XII intact.  Deep tendon reflexes are reduced to the left ankle and left knee, otherwise normal.  Sensory exam is normal and the upper extremities mildly  diminished bilateral feet.  There is some intrinsic atrophy bilateral feet.  There is limited range of motion, gets about 10 degrees dorsiflexion and plantar flexion in the left ankle.  POST ADMISSION PHYSICIAN EVALUATION: 1. Functional deficits secondary to deconditioning.  She has history     of right upper lobe and lower lobe adenocarcinoma of the lung,     status post resection.  She has  chronic pain and contracture of the     left ankle.  She has AFib with poor exercise tolerance and rapid     ventricular response. 2. The patient was admitted to receive collaborative interdisciplinary     care between the physiatrist, rehab nursing staff, and the therapy     team. 3. The patient's level of medical complexity and substantial therapy     needs in context of that medical necessity cannot be provide at a     lesser intensity of care. 4. The patient has experienced substantial functional loss from her     baseline.  Upon functional assessment at the time of preadmission     screening, the patient was at a supervision-level bed mobility, min-     assist transfers, min-assist ambulation with walker 60 feet, ADLs     were not assessed yet.  Upon functional assessment today, the     patient was min-to-guard bed mobility, min-guard transfers, min-     assist ambulation up to 150 feet, min-assist lower body dressing,     and min-assist toilet transfers.  Judging by the patient's     diagnosis, physical exam, and functional history, the patient has     demonstrated the ability to make functional progress which will     result in measurable gains while on inpatient rehab.  These gains     will be of substantial and practical use upon discharge to home in     facilitating mobility, self-care and independence.  Interim changes     in medical status since preadmission screening are detailed in the     history of present illness. 5. Physiatrist will provide 24-hour management of medical needs as     well as oversight of the therapy plan/treatment and provide     guidance as appropriate regarding interaction of the two. 6. A 24-hour rehab nursing will assess in the management of skin,     bowel, bladder, and help to integrate therapy, concepts, techniques     and education. 7. PT will assess and treat for pre-gait training, gait training,     endurance, safety.  Goals are for a  modified independent level with     all mobility. 8. OT will assess and treat for ADLs, cognitive perceptual skills,     safety, equipment, endurance.  Goals are for modified independent     level for upper body and lower body ADLs. 9. Case management and social work will assess and treat for     psychosocial issues and discharge planning. 1o.  Team conference will be held weekly to assess the patient's progress/goals and to determine barriers to discharge. 1. The patient demonstrated sufficient medical stability and exercise     capacity to tolerate at least 3 hours of therapy per day at least 5     days per week. 2. Estimated length of stay is 5-7 days.  Prognosis for further     functional improvement is excellent.  MEDICAL PROBLEM LIST AND PLAN: 1. Atrial fibrillation.  Monitor rate.  Continue Cardizem, warfarin  per pharmacy protocol.  Rest for heart rates that elevate above     120. 2. Diabetes type 2.  Continue metformin b.i.d. 3. Degenerative joint disease right knee.  Wear orthosis.  Continue     fentanyl patch. 4. Chronic pain in left ankle.  Fentanyl patch, p.r.n. Vicodin for     pain. 5. Peptic ulcer disease.  Add Protonix while the patient is on     steroids. 6. Hypertension.  Monitor on Cozaar and Cardizem.  Motivation and mood appear to be adequate for full participation in inpatient rehabilitation program.  Explained Rehab Medicine Service with the patient, answered questions.     Erick Colace, M.D.   ______________________________ Ranelle Oyster, M.D.    AEK/MEDQ  D:  03/13/2011  T:  03/13/2011  Job:  [id number]  cc:   Zada Girt, M.D. Oley Balm Sung Amabile, MD  Electronically Signed by Claudette Laws M.D. on 03/18/2011 01:37:56 PM Electronically Signed by Faith Rogue M.D. on 03/24/2011 12:23:17 PM

## 2011-03-19 LAB — GLUCOSE, CAPILLARY: Glucose-Capillary: 106 mg/dL — ABNORMAL HIGH (ref 70–99)

## 2011-03-20 LAB — GLUCOSE, CAPILLARY
Glucose-Capillary: 89 mg/dL (ref 70–99)
Glucose-Capillary: 97 mg/dL (ref 70–99)
Glucose-Capillary: 133 mg/dL — ABNORMAL HIGH (ref 70–99)
Glucose-Capillary: 113 mg/dL — ABNORMAL HIGH (ref 70–99)

## 2011-03-20 LAB — PROTIME-INR: INR: 1.79 — ABNORMAL HIGH (ref 0.00–1.49)

## 2011-03-21 DIAGNOSIS — R5381 Other malaise: Secondary | ICD-10-CM

## 2011-03-21 LAB — GLUCOSE, CAPILLARY
Glucose-Capillary: 85 mg/dL (ref 70–99)
Glucose-Capillary: 129 mg/dL — ABNORMAL HIGH (ref 70–99)
Glucose-Capillary: 112 mg/dL — ABNORMAL HIGH (ref 70–99)

## 2011-03-21 LAB — PROTIME-INR: INR: 2.11 — ABNORMAL HIGH (ref 0.00–1.49)

## 2011-03-22 LAB — GLUCOSE, CAPILLARY: Glucose-Capillary: 102 mg/dL — ABNORMAL HIGH (ref 70–99)

## 2011-04-01 DIAGNOSIS — Z0271 Encounter for disability determination: Secondary | ICD-10-CM

## 2011-04-16 NOTE — Discharge Summary (Signed)
[name], [name]           ACCOUNT NO.:  [id number]  MEDICAL RECORD NO.:  [id number]  LOCATION:                                 FACILITY:  PHYSICIAN:  Evelene Croon, M.D.     DATE OF BIRTH:  November 22, 1934  DATE OF ADMISSION:  02/27/2011 DATE OF DISCHARGE:                              DISCHARGE SUMMARY   FINAL DIAGNOSES: 1. Right upper lobe mass, positive invasive well-differentiated     adenocarcinoma, spanning 6 cm.  T4N0. 2. Wedge resection of right lower lobe showing invasive well to     moderately differentiated adenocarcinoma of 23 cm.  IN-HOSPITAL DIAGNOSES: 1. Acute blood loss anemia postoperatively. 2. Volume overload postoperatively. 3. Acute respiratory failure secondary to pulmonary infiltrates,     infectious versus inflammatory. 4. Decondition postoperatively.  SECONDARY DIAGNOSES: 1. History of depression. 2. Insulin-dependent diabetes mellitus. 3. Hypertension. 4. Osteoporosis. 5. Hyperlipidemia. 6. Renal insufficiency. 7. Sleep apnea. 8. Atrial fibrillation status post ablation at [facility] in 2000. 9. Gastroesophageal reflux disease. 10.Status post cholecystectomy. 11.Status post appendectomy. 12.Status post abdominal hysterectomy. 13.Status post tonsillectomy. 14.Status post joint replacement.  IN-HOSPITAL OPERATIONS AND PROCEDURES:  Flexible fiberoptic bronchoscopy with right thoracotomy with right upper lobectomy and wedge resection of right lower lobe lung mass with insertion of On-Q pain pump done by Dr. Laneta Simmers on February 27, 2011.  HISTORY AND PHYSICAL AND HOSPITAL COURSE:  The patient is a 76 year old female who is undergoing preoperative workup to undergo pelvic floor surgery for cystocele and rectocele by urologist in [facility]. Preoperative chest x-ray showed a mass in the right upper lobe.  CT scan of chest was done on January 13, 2011, showing a 5.5 x 4.6 cm spiculated mass in the right apex.  There is also 1.9 x 1.4 cm right lower  lobe mass located posteriorly and peripherally.  There is a 5.7 x 4.9 mm nodule in the left lower lobe.  There is also hypodense lesion within the liver and possible lucencies at T10 and T12 vertebrae.  The patient was referred to Dr. Chales Abrahams and Pulmonary Medicine and underwent bronchoscopy with biopsies on January 29, 2011.  Biopsies of the right upper lobe showed adenocarcinoma.  She then underwent PET scan on February 05, 2011, that showed hypermetabolic activity within the right apical lung mass as well as a right lower lobe lung mass with an SUV of 2.85. 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  There is also no hypermetabolic activity seen in the chest, abdomen or pelvis and none in the spine.  MRI of the brain did not show any evidence of metastatic disease.  The patient was referred to Dr. Laneta Simmers.  Dr. Laneta Simmers discussed with the patient undergoing right upper lobectomy and wedge resection right lower lobe lung mass.  He discussed risks and benefits with the patient.  The patient nods her understanding and agreed to proceed.  Surgery was scheduled for February 27, 2011.  For further details of the patient's past medical history and physical exam, please see dictated H and P.  The patient was taken to the operating room on February 27, 2011, where  she underwent flexible fiberoptic bronchoscopy with right thoracotomy with right upper lobectomy and wedge resection right lower lobe lung mass with insertion of On-Q pain pump.  The patient's pathology report came back showing the right upper lobe invasive well-differentiated adenocarcinoma spanning 6 cm.  The wedge resection of the right lower lobe was invasive, well to moderately differentiated adenocarcinoma spanning 3 cm.  TNM C4N0.  The patient tolerated this procedure well and was transferred to the surgical intensive care unit in stable  condition. She had been able to be extubated following surgery.  Postextubation, she was noted to be alert and oriented x4.  Neuro intact.  She was noted to be hemodynamically stable.  During the patient's postoperative course, her pulmonary status was noted to be stable and she was on a 100% non-rebreather.  Chest x-ray obtained postop day #1 showed increased perihilar edema suggesting fluid overload.  She was started on IV Lasix at that time.  She was also noted to have small air leak from her chest tubes.  Unfortunately, the patient's pulmonary status started to decline.  Followup chest x-ray showed increasing in left upper lobe consolidation/infiltrates.  It was felt that the patient may be developing a left upper lobe pneumonia.  She was started on IV antibiotics at that time.  She was also continued on IV Lasix as well. The patient was continued on a non-rebreather to maintain O2 sats greater than 90%.  The patient's air leak from her right chest tubes had resolved and her chest tubes were placed to water seal on postop day 4. Followup chest x-rays obtained showing worsening of the left upper lobe airspace consolidation/infiltrate.  Critical Care was consulted for assistance.  They felt that since this consolidation was not improving with antibiotic that she was developing respiratory failure secondary to inflammatory process unknown.  She was started on steroids at that point.  Yearly chest x-rays were obtained and this consolidation slowly started to improve.  The patient was switched over from IV Solu-Medrol to steroid taper dose by Pulmonary.  Chest x-rays continued to improve. During this time, the patient's air leak had remained resolved.  The patient had minimal drainage from chest tubes and chest tubes were discontinued.  The patient was able to be switched over from a non- rebreather to nasal cannula.  During this time, she was encouraged to use her incentive spirometer.   Most recent chest x-ray on March 11, 2011, shows left lung airspace disease present with mild right basilar atelectasis and airspace disease unchanged from her prior x-ray on March 10, 2011.  Overall, this was noted to be improving.  The patient was able to be weaned off oxygen with O2 saturations maintaining greater than 90% on room air.  This was by March 12, 2011.  During this time, the patient's IV antibiotics had been discontinued and she remains on a p.o. steroid taper dose.  During the patient's postoperative course, she has remained in atrial fibrillation rate controlled.  The patient did have this preoperatively and was on Coumadin for this.  She was started on heparin drip postoperatively while chest tubes were still in.  Once chest tubes were discontinued, she was restarted on Coumadin.  IV heparin was continued until the patient was therapeutic.  Daily PT/INRs were obtained and Coumadin was adjusted appropriately.  Most recent INR on today, March 12, 2011 is 1.72.  I will recheck in the a.m. with hopes to discontinue IV heparin at that time.  The patient did have  a prolonged SICU stay secondary to pulmonary status.  While in the unit, Physical Therapy was consulted as well as Occupational Therapy and started to work with the patient.  The patient was noted to be deconditioned postoperatively.  They continued to work with her on transfer out to 2000.  The patient was requesting possible inpatient rehab placement.  [facility] was consulted and evaluated the patient.  They felt that the patient met requirements and were in agreement to take her to [facility] once medically stable.  During this time, the patient was tolerating diet well.  All incisions are noted to be clean, dry and intact and healing well.  The patient's blood sugars were followed closely postoperatively.  She had been started on Lantus insulin initially as well as no coverage  due to being on the steroids.  She was eventually switched over to her metformin.  We will plan to continue no coverage while she is in rehab. Currently, blood sugars are controlled.  On March 12, 2011, the patient was noted to be afebrile in normal sinus rhythm.  Blood pressure stable.  O2 sats greater than 90% on room air.  Most recent lab work shows INR of 1.72.  White blood cell count 16.7, hemoglobin of 11.4, hematocrit 33.9, platelet count 448.  Sodium of 137, potassium 3.7, chloride of 100, bicarbonate 31, BUN of 18, creatinine of 0.47, glucose 123.  The patient is tentatively ready for transfer to [facility] in the a.m. March 13, 2011, if she remains stable.  FOLLOWUP APPOINTMENTS:  Followup appointment will be arranged with Dr. Laneta Simmers prior to the patient being discharged from [facility].  ACTIVITY:  The patient is to continue working with physical therapy and occupational therapy daily.  She is instructed no heavy lifting over 10 pounds and no driving until released to do so.  INCISIONAL CARE:  The patient is told to start shower and washing her incisions using soap and water.  Please contact us if she develops any drainage or opening from any of her incision sites.  DIET:  The patient educated on diet to be low-fat, low-salt as well as diabetic diet.  DISCHARGE MEDICATIONS: 1. Tylenol 650 mg q.4 h. p.r.n. 2. Albuterol nebulizers 2-1/2 mg every 4 hours p.r.n.. 3. Xanax 0.25 mg t.i.d. 4. Ferrous sulfate 325 mg daily. 5. Vicodin 5/325 every 4 hours p.r.n. pain nb1-2 tablets. 6. NovoLog FlexPen 1-15 units subcu t.i.d. with meals. 7. Magic mouth wash 5 mL t.i.d. 8. Prednisone 20 mg from March 13, 2011, through March 15, 2011, then prednisone 10 mg daily from March 16, 2011, through     March 18, 2011, then prednisone 10 mg every 48 hours from     March 20, 2011, through March 24, 2011, then discontinue. 9. Ultram 50 mg  1-2 tablets q.6 h. p.r.n. pain. 10.Colchicine 0.6 mg p.r.n. 11.Coumadin 5 mg daily. 12.Allopurinol 100 mg daily. 13.Atenolol 25 mg daily. 14.Calcium carbonate with vitamin D b.i.d. 15.Digoxin 0.125 mg daily. 16.Diltiazem CD 180 mg daily. 17.Estradiol 1 mg daily. 18.Lomotil every 6 hours p.r.n. 19.Losartan 50 mg daily. 20.Metformin 1000 mg b.i.d. 21.Multivitamin daily. 22.Ultram 4 mg 1-2 tablets q.8 h. p.r.n. 23.Pravastatin 40 mg daily.     Sol Blazing, PA   ______________________________ Evelene Croon, M.D.    KMD/MEDQ  D:  03/12/2011  T:  03/12/2011  Job:  [id number]  Electronically Signed by Cameron Proud PA on 03/28/2011 08:56:35 AM Electronically Signed  by Evelene Croon M.D. on 04/16/2011 02:40:42 PM

## 2011-04-22 NOTE — Discharge Summary (Signed)
[name], DALEO NO.:  [id number]  MEDICAL RECORD NO.:  [id number]  LOCATION:  4008                         FACILITY:  [facility]  PHYSICIAN:  Ranelle Oyster, M.D.DATE OF BIRTH:  [birth year]  DATE OF ADMISSION:  03/13/2011 DATE OF DISCHARGE:  03/21/2011                              DISCHARGE SUMMARY   DISCHARGE DIAGNOSES: 1. Deconditioning past video-assisted thoracoscopic surgery for right     lung cancer and multiple medical issues. 2. Diabetes mellitus type 2. 3. Chronic pain, left ankle. 4. Hypertension. 5. Pneumonia, treated.  HISTORY OF PRESENT ILLNESS:  [name] is a 76 year old female with history of diabetes mellitus, hypertension, recent diagnosis of right upper lobe and right lower lobe adenocarcinoma during workup for cystocele, rectocele repair.  The patient was admitted on February 27, 2011 for right thoracotomy with right upper lobe lobectomy and mets resection of right lower lobe mass by Dr. Laneta Simmers postop with left upper lobe consolidation due to pneumonia and was treated with IV antibiotics. Critical Care Medicine was consulted for input and Solu-Medrol was added for hypoxia.  Fluid overload has been treated with diuretics.  Pulmonary and Critical Care Medicine consults, 10-day slow taper off steroids and weaning off O2.  Postop IV heparin initiated and chronic Coumadin resumed.  Currently, a shortness of breath is resolving; however, the patient continues to have tachycardia with activity with heart rates in 130s.  Requires multiple rest breaks.  The patient was evaluated by Rehab and we felt that she would benefit from a CIR program.  PAST MEDICAL HISTORY:  Significant for: 1. DM type 2. 2. Hypertension. 3. Chronic renal insufficiency. 4. Obstructive sleep apnea. 5. Peptic ulcer disease. 6. Cholecystectomy. 7. Right knee end-stage DJD. 8. Peripheral neuropathy. 9. Chronic pain in left ankle due to fracture and  reconstruction. 10.Gout. 11.Hysterectomy. 12.Left total knee replacement. 13.AFib with history of cardioversion. 14.History of emphysema. 15.Depression. 16.Anxiety disorder. 17.Hypersomnia.  ALLERGIES: 1. ADENOSINE. 2. KEFLEX. 3. CIPRO. 4. PRINIVIL. 5. COMPAZINE. 6. TOPROL-XL. 7. FLAGYL. 8. PAXIL. 9. PENICILLIN. 10.SOTALOL . 11.NEOSPORIN. 12.AMIODARONE.  REVIEW OF SYMPTOMS:  Positive for shortness of breath, chronic issues with diarrhea, nocturia, right lower extremity radiculopathy, insomnia, anxiety, depression.  FAMILY HISTORY:  Positive for bronchiectasis, CVA, and coronary artery disease.  SOCIAL HISTORY:  The patient is married, lives in tri-level home with two steps at entry.  Does not use any tobacco use or alcohol rarely.  FUNCTIONAL HISTORY:  The patient was independent and active prior to admission.  She required cane occasionally when left ankle flared up. Takes care of two great grandchildren.  FUNCTIONAL STATUS:  The patient is min-to-guard assist transfers, min-to- guard assist ambulating 80-155 feet with rolling walker and multiple rest breaks.  She requires setup to min assist for upper body care, min assist for lower body care.  PHYSICAL EXAMINATION:  VITAL SIGNS:  Blood pressure 166/88, pulse 76, respiratory rate 18, and temperature 98.0. GENERAL:  The patient is well-developed and well-nourished elderly female in no acute distress. HEENT:  Eyes anicteric, noninjected.  Oral mucosa is pink and moist with full set dentures in place.  Hearing intact. NECK:  Supple without masses or JVD. LUNGS:  Clear to auscultation bilaterally without  wheezes, rales or rhonchi. HEART:  Irregularly irregular. ABDOMEN:  Soft and nontender with positive bowel sounds. SKIN:  Right thoracotomy incision.  Upper incision is clean, dry, and intact.  Lower incision with two sutures in place. NEUROLOGIC:  The patient is alert and oriented x3.  Motor strength is 4/5  bilateral deltoids, biceps, triceps, finger flexures, hip flexures, quad, TA, gastroc.  Mood, memory, and affect are all intact.  Cranial nerves II through XII intact.  DTRs reduced in left ankle and left knee, otherwise normal.  Sensory exam normal and mildly diminished in bilateral feet.  There is some intrinsic atrophy in bilateral feet, limited range of motion in left ankle.  HOSPITAL COURSE:  [name] was admitted to rehab on March 13, 2011, for inpatient therapies to consist of PT/OT at least 3 hours 5 days a week.  Past admission, physiatrist, rehab RN and therapy team have worked together to provide customized collaborative interdisciplinary care.  Rehab RN has worked with the patient on bowel and bladder program as well as skin care monitoring.  The patient's pain was managed with use of fentanyl patch as well as Vicodin.  Lidocaine patch was also ordered to right ribs to help with chest wall pain.  The patient's diabetes was monitored with a.c. and at bedtime basis. Sliding scale insulin was used for elevated blood sugars and these were initially noted to be running high due to steroids on board.  Blood sugars in the last 24 hours have ranged from 80s to 130s range.  The patient does have history of chronic diarrhea.  She was started on Florastor with improvement in her GI symptomatology.  Her metformin was increased to 1250 mg b.i.d. and the patient was noted to have recurrent diarrhea with this.  Currently, metformin is decreased to 500 mg p.o. b.i.d.  The patient is to check her blood sugars on t.i.d. basis for now and follow up with MD for further adjustment in her diabetes regimen. The patient's blood pressures have been checked on b.i.d. basis.  These are currently ranging from mid 90s to 100 systolic, 50s to 16X diastolic, heart rate has been in 70s to 80s range.  Labs done past admission revealed H and H at 12.04 and 37.2, white count 11.0, platelets  408.  Recheck of lytes revealed sodium 135, potassium 4.0, chloride 95, CO2 33, BUN 17, creatinine 0.56, glucose 77.  A UA/UC was done past admission and this showed 5000 colonies insignificant growth. The patient was maintained on Xanax t.i.d. to help with her anxiety levels.  Overall endurance as well as anxiety have greatly improved.  During the patient's stay in rehab, weekly team conferences were held to monitor the patient's progress, set goals as well as discuss barriers to discharge.  At the time of admission, the patient was limited by impaired gait with decreased activity tolerance, decrease in strength as well as decrease in balance.  She was eventually mod assist for balance and mobility.  She was only able to tolerate walking less than 50 feet due to shortness of breath and tachycardia.  Currently, the patient has progressed to being at modified independent level for transfers and mobility.  She is min assist to navigate stairs without rails.  Husband has been taught to assist safely and currently, the patient is ambulating greater than 200 feet with rollator and can ascend a flight of stairs.  The patient to continue with further follow up home health physical therapy past discharge.  OT  has worked with the patient on self- care tasks.  At admission, the patient would required min assist for all ADL tasks due to decreased activity tolerance as well as frequent rest breaks.  Currently, she has met goals and is at modified independent level for all ADLs.  She does require supervision for tub transfers with tub bench.  She is independent with energy conservation techniques and requires about two rest breaks to complete bathing-dressing tasks at shower tub level.  Further followup home health OT to continue past discharge.  [facility] has been set up with home health RN to draw protime next on Monday with results to best [facility].  On March 22, 2011,  the patient is discharged to home.  DISCHARGE MEDICATIONS: 1. Fentanyl patch 25 mcg an hour, change to q.72 h. two boxes Rx for     upcoming month with refill for month after. 2. Ferrous sulfate 325 mg p.o. per day. 3. Lidocaine patch apply to right chest wall, on at 8 p.m. and off at     8 a.m. daily. 4. Micro-Guard powder to the area b.i.d. 5. Victoza 1.8 mg subcu q.p.m. 6. Nystatin mouthwash 10 mL p.o. q.i.d. p.r.n. 7. Protonix 40 mg p.o. per day. 8. Florastor 250 mg p.o. b.i.d. 9. Coumadin 5 mg half p.o. on Mondays and Fridays, hold pill all other     days. 10.Metformin 1000 mg half p.o. b.i.d. 11.Allopurinol 100 mg p.o. per day. 12.Atenolol 25 mg p.o. per day. 13.Xanax 0.25 mg p.o. t.i.d. p.r.n. anxiety. 14.Colcrys 0.6 mg per day p.r.n. 15.Calcium carbonate with vitamin D b.i.d. 16.Digoxin 0.125 mg daily. 17.Cardizem CD 180 mg p.o. b.i.d. 18.Estradiol 1 mg p.o. per day. 19.Hydrocodone - APAP 5 - 325 one to two q.4 h. p.r.n. pain, #120 Rx     with one refill. 20.Lomotil one p.o. q.6 h. p.r.n. diarrhea. 21.Multivitamin one per day. 22.Pravastatin 40 mg p.o. per day.  Diet is carb-modified medium.  Activity level is intermittent supervision.  No strenuous activity, use walker for ambulation.  SPECIAL INSTRUCTIONS:  Do not use losartan or Tylenol.  No driving.  No alcohol.  [facility] to provide PT, OT and RN with next protime draw on March 24, 2011.  FOLLOWUP:  The patient to follow up with Dr. Riley Kill on April 30, 2011 at 12:30 for 1 p.m.  Follow up with Dr. Kristen Loader for routine check.  Follow up with Dr. Edwyna Shell and Dr. Laneta Simmers for postop check.  Follow up with Dr. Clarita Leber for routine check.     Delle Reining, P.A.   ______________________________ Ranelle Oyster, M.D.    PL/MEDQ  D:  03/21/2011  T:  03/22/2011  Job:  [id number]  cc:   Evelene Croon, M.D. [facility] Dr. Katrina Stack  Electronically Signed by Osvaldo Shipper. on 03/25/2011 02:19:27  PM Electronically Signed by Faith Rogue M.D. on 04/22/2011 09:13:08 AM

## 2011-04-30 ENCOUNTER — Encounter: Payer: Medicare Other | Attending: Physical Medicine & Rehabilitation | Admitting: Physical Medicine & Rehabilitation

## 2011-05-07 ENCOUNTER — Encounter: Payer: Medicare Other | Attending: Physical Medicine & Rehabilitation | Admitting: Physical Medicine & Rehabilitation

## 2011-05-07 DIAGNOSIS — M1710 Unilateral primary osteoarthritis, unspecified knee: Secondary | ICD-10-CM

## 2011-05-07 DIAGNOSIS — C3490 Malignant neoplasm of unspecified part of unspecified bronchus or lung: Secondary | ICD-10-CM | POA: Insufficient documentation

## 2011-05-07 DIAGNOSIS — F329 Major depressive disorder, single episode, unspecified: Secondary | ICD-10-CM

## 2011-05-07 DIAGNOSIS — G609 Hereditary and idiopathic neuropathy, unspecified: Secondary | ICD-10-CM

## 2011-05-07 DIAGNOSIS — G894 Chronic pain syndrome: Secondary | ICD-10-CM | POA: Insufficient documentation

## 2011-05-07 DIAGNOSIS — S82843A Displaced bimalleolar fracture of unspecified lower leg, initial encounter for closed fracture: Secondary | ICD-10-CM

## 2011-05-07 NOTE — Assessment & Plan Note (Signed)
[name] is back after rehab admission where she came to [place] after a VATS for lung cancer.  She has had a chronic pain syndrome related to the back and her left ankle and right knee.  She had been seeing a Pain Clinic in [place] I believe.  We sent her home with fentanyl and hydrocodone.  She has been doing fairly well with those.  She is using the Lidoderm patch for chest wall pain.  She has received the first cycle of 4 of chemotherapy which includes 2 weeks on and 1 week off. The chemo was making her generally weak and she has had some myalgias and general pain as well.  REVIEW OF SYSTEMS:  Notable for the above.  Full 12 point review is in the written health history section of the chart.  She has had some difficulty keeping on weight and with her appetite.  SOCIAL HISTORY:  The patient is married and her spouse is with her today.  PHYSICAL EXAMINATION:  VITAL SIGNS:  Blood pressure is 103/58, pulse 101, respiratory rate 16.  She is satting 92% on room air. GENERAL:  The patient is pleasant, alert.  Her weight seemed fairly stable from when I had her in the hospital.  She is well dressed and kempt today. MUSCULOSKELETAL:  Strength grossly 5/5.  She does have some pain inhibition, weakness at the right knee with minimal swelling noted around the patella.  She is a bit antalgic with the right knee and weightbearing and somewhat on the left ankle as well.  Left ankle was painful with rotation and extension flexion today on exam. NEUROLOGIC:  Cognitively she is alert and appropriate. HEART:  Regular. CHEST:  Clear. ABDOMEN:  Soft, nontender.  Right chest wall remains generally sensitive to touch around the incision site.  ASSESSMENT: 1. History of VATS for right lung cancer 2. Chronic pain syndrome related to her chest wall, right knee, and     left ankle.  PLAN: 1. Discussed the fact that she was a bit short with her fentanyl     patches and she states that 2 of them fell off  and she is not sure     what the other 2 or 3 are.  She.  She thinks that they may be in a     drawer at home with her medications.  I asked her to follow up on     these.  I discussed with her the importance of keeping up with     these patches and counts.  I gave her a refill for fentanyl patch     25 mcg #10 with another prescription for next month. 2. Refilled her hydrocodone 5/325, 1 q.6 hours p.r.n. #120. 3. Refilled Lidoderm patches 5% to chest wall daily, #60. 4. We will look at potentially doing Synvisc injections which she has     found helpful in the past.  Potentially look at doing these over     the next 2-3 months.  Her knee has not started acting up as of yet,     so we will keep this on the back burner. 5. I will schedule her back with me in about 2 months for followup.     Ranelle Oyster, M.D. Electronically Signed    ZTS/MedQ D:  05/07/2011 14:28:16  T:  05/07/2011 19:50:39  Job #:  [id number]

## 2011-06-04 ENCOUNTER — Inpatient Hospital Stay: Payer: Medicare Other | Admitting: Physical Medicine & Rehabilitation

## 2011-07-07 ENCOUNTER — Encounter: Payer: Medicare Other | Attending: Physical Medicine & Rehabilitation | Admitting: Physical Medicine & Rehabilitation

## 2011-07-07 DIAGNOSIS — C3490 Malignant neoplasm of unspecified part of unspecified bronchus or lung: Secondary | ICD-10-CM | POA: Insufficient documentation

## 2011-07-07 DIAGNOSIS — IMO0002 Reserved for concepts with insufficient information to code with codable children: Secondary | ICD-10-CM

## 2011-07-07 DIAGNOSIS — M25579 Pain in unspecified ankle and joints of unspecified foot: Secondary | ICD-10-CM | POA: Insufficient documentation

## 2011-07-07 DIAGNOSIS — M19079 Primary osteoarthritis, unspecified ankle and foot: Secondary | ICD-10-CM

## 2011-07-07 DIAGNOSIS — M25569 Pain in unspecified knee: Secondary | ICD-10-CM | POA: Insufficient documentation

## 2011-07-07 DIAGNOSIS — Z9889 Other specified postprocedural states: Secondary | ICD-10-CM | POA: Insufficient documentation

## 2011-07-07 DIAGNOSIS — G894 Chronic pain syndrome: Secondary | ICD-10-CM | POA: Insufficient documentation

## 2011-07-07 DIAGNOSIS — M1710 Unilateral primary osteoarthritis, unspecified knee: Secondary | ICD-10-CM

## 2011-07-07 DIAGNOSIS — R0789 Other chest pain: Secondary | ICD-10-CM | POA: Insufficient documentation

## 2011-07-07 NOTE — Assessment & Plan Note (Signed)
[name] is back regarding her multiple pain complaints.  She is on the last cycle or 2 of her chemotherapy and seems to be doing fairly well except for some recent nausea and vomiting.  She has had some more pain in her right surgical/thoracic site over the last week or so.  She can no longer afford the Lidoderm patches because of the extreme cost.  Her pain is about 3-4/10.  Pain is sharp, stabbing and aching.  Sleep can be poor at times.  REVIEW OF SYSTEMS:  Notable for the above.  Full 12-point review is in the written health and history section of the chart.  SOCIAL HISTORY:  As noted above.  Husband is with her today and very supportive.  PHYSICAL EXAMINATION:  VITAL SIGNS:  Blood pressure is 125/69, pulse is 83, respiratory rate 16 and she is satting 98% on room air. GENERAL:  The patient is pleasant and alert.  She is wearing a bona to cover her hair.  Her color is good and weight seems to have increased. NEUROLOGIC:  Cognitively, she is aharp and appropriate. HEART:  Regular. CHEST:  Clear. ABDOMEN:  Soft, nontender. EXTREMITIES:  She walked for me today and favors the right knee and left ankle a bit with weightbearing.  There is some antalgia noted on either side.  She has swelling at the right ankle trace to 1+ today.  Upper extremity strength is generally intact. HEART:  Regular. CHEST:  Clear.  ASSESSMENT: 1. History of VATS for right lung cancer. 2. Chronic pain syndrome related to her chest wall, right knee and     left ankle areas.  PLAN: 1. She continues to do well with the fentanyl patches.  She is doing a     better job keeping up with these with the family's help.  We will     stay with the 25 mcg q.72 h. dosed. 2. I introduced lidocaine gel 5% to the chest wall and ankles as     needed t.i.d. 3. Still consider Synvisc injections in the future if needed.  We     discussed appropriate shoe wear today. 4. Pharmacy will call with the hydrocodone prescription as  needed. 5. I will see her back in 2 months.  She was given 2 fentanyl     prescriptions today.     Ranelle Oyster, M.D. Electronically Signed    ZTS/MedQ D:  07/07/2011 13:29:40  T:  07/07/2011 19:22:46  Job #:  [id number]

## 2011-09-02 ENCOUNTER — Encounter: Payer: Self-pay | Admitting: Physical Medicine & Rehabilitation

## 2011-09-02 ENCOUNTER — Encounter: Payer: Medicare Other | Attending: Physical Medicine & Rehabilitation | Admitting: Physical Medicine & Rehabilitation

## 2011-09-02 DIAGNOSIS — G8912 Acute post-thoracotomy pain: Secondary | ICD-10-CM | POA: Insufficient documentation

## 2011-09-02 DIAGNOSIS — G629 Polyneuropathy, unspecified: Secondary | ICD-10-CM

## 2011-09-02 DIAGNOSIS — G250 Essential tremor: Secondary | ICD-10-CM

## 2011-09-02 DIAGNOSIS — M1710 Unilateral primary osteoarthritis, unspecified knee: Secondary | ICD-10-CM | POA: Insufficient documentation

## 2011-09-02 DIAGNOSIS — G609 Hereditary and idiopathic neuropathy, unspecified: Secondary | ICD-10-CM | POA: Insufficient documentation

## 2011-09-02 DIAGNOSIS — M19079 Primary osteoarthritis, unspecified ankle and foot: Secondary | ICD-10-CM | POA: Insufficient documentation

## 2011-09-02 DIAGNOSIS — G252 Other specified forms of tremor: Secondary | ICD-10-CM

## 2011-09-02 DIAGNOSIS — G8918 Other acute postprocedural pain: Secondary | ICD-10-CM

## 2011-09-02 DIAGNOSIS — IMO0002 Reserved for concepts with insufficient information to code with codable children: Secondary | ICD-10-CM | POA: Insufficient documentation

## 2011-09-02 DIAGNOSIS — M19072 Primary osteoarthritis, left ankle and foot: Secondary | ICD-10-CM

## 2011-09-02 DIAGNOSIS — M1711 Unilateral primary osteoarthritis, right knee: Secondary | ICD-10-CM

## 2011-09-02 MED ORDER — FENTANYL 25 MCG/HR TD PT72: 1.0000 | MEDICATED_PATCH | TRANSDERMAL | Status: DC

## 2011-09-02 NOTE — Patient Instructions (Signed)
Continue to increase exercise capacity as tolerated.  Apply lidocaine gel with gauze and bandage if you choose to use it.  Weighted utencils

## 2011-09-02 NOTE — Progress Notes (Signed)
Subjective:    Patient ID: [name], female    DOB: 10-12-1934, 77 y.o.   MRN: [id number]  HPI [name] is back regarding her chronic pain problems.  She just finished another cycle of CTX.  She is using less hydrocodone which she is happy with.  She developed a rash/itching with one type of generic hydrocodone.  Energy levels are showing improvement over the last couple days. Hasn't used lidoderm gel yet because she had some lidoderm patches left.    She's gone back to some of the activities she was doing before including taking care of her young grandchildren and cats.   Having problems with bladder prolapse. She is seeing urology and may need a bladder tack. She has difficulty with urine retention as a result. Bowels are working without issue.  Overall her knee is feeling quite good.  Her ankle gives her occasional pain.    Pain Inventory Average Pain 5 Pain Right Now 2 My pain is intermittent, burning and stabbing  In the last 24 hours, has pain interfered with the following? General activity 4 Relation with others 0 Enjoyment of life 1 What TIME of day is your pain at its worst? morning and night Sleep (in general) Fair  Pain is worse with: walking, standing and some activites Pain improves with: rest, heat/ice, medication and injections Relief from Meds: 8  Mobility use a cane how many minutes can you walk? 5 ability to climb steps?  yes do you drive?  yes Do you have any goals in this area?  no  Function retired I need assistance with the following:  meal prep and household duties Do you have any goals in this area?  no  Neuro/Psych bladder control problems weakness tremor trouble walking dizziness Bladder prolapse was in process of being prepared for 'tacking' when lung ca discovered. Surgery not performed Prior Studies Any changes since last visit?  yes x-rays CT/MRI left lung has a "speck in it-being watched for now  Physicians involved in your  care Any changes since last visit?  yes Primary care [facility] Dr Earma [place] urologist Dr Edwin Cap      Review of Systems  Constitutional: Positive for diaphoresis and unexpected weight change.       Night sweats and weight loss  HENT: Negative.   Eyes: Negative.   Respiratory: Negative.   Cardiovascular: Positive for leg swelling.  Gastrointestinal: Positive for nausea.  Genitourinary: Positive for difficulty urinating.       Retention  Musculoskeletal: Positive for gait problem.  Skin: Negative.   Neurological: Positive for dizziness, tremors and weakness.       Objective:   Physical Exam  Constitutional: She is oriented to person, place, and time. She appears well-developed and well-nourished.  HENT:  Head: Normocephalic and atraumatic.  Eyes: EOM are normal. Pupils are equal, round, and reactive to light.  Neck: Normal range of motion. Neck supple.  Cardiovascular: Normal rate and regular rhythm.   Pulmonary/Chest: Effort normal and breath sounds normal.  Abdominal: Soft.  Musculoskeletal: Normal range of motion.       Mild knee pain with palplation.  Ankle remains tender on left with wb and PROM.  Mild swelling is appreciated  Neurological: She is alert and oriented to person, place, and time. She has normal strength. A sensory deficit (stocking glove distally) is present. No cranial nerve deficit.       Mild intention tremor.  s  Skin: Skin is warm.  Psychiatric: She has a  normal mood and affect. Her speech is normal and behavior is normal. Judgment and thought content normal. Cognition and memory are normal.          Assessment & Plan:  ASSESSMENT:  1. History of VATS for right lung cancer.  2. Chronic pain syndrome related to her chest wall, right knee and  left ankle areas.  3. Intentional tremor right greater than left 4. Mild peripheral neuropathy  PLAN:  1. She continues to do well with the fentanyl patches. She is doing a  better job  keeping up with these with the family's help. We will  stay with the 25 mcg q.72 h. dosed.  2. May use lidoderm patches or lidocaine gel 5% to the chest wall and ankles as  needed t.i.d.  3. Still consider Synvisc injections in the future if needed. We  discussed appropriate shoe wear today.  4. Refill hydrocodone as needed 5. I will see her back in 2 months. She was given 2 fentanyl  prescriptions today.

## 2011-09-25 ENCOUNTER — Encounter: Payer: Self-pay | Admitting: Physical Medicine & Rehabilitation

## 2011-10-29 ENCOUNTER — Ambulatory Visit (HOSPITAL_COMMUNITY)
Admission: RE | Admit: 2011-10-29 | Discharge: 2011-10-29 | Disposition: A | Discharge status: Home / Self Care | Payer: Medicare Other | Source: Ambulatory Visit | Attending: Physical Medicine & Rehabilitation | Admitting: Physical Medicine & Rehabilitation

## 2011-10-29 ENCOUNTER — Encounter: Payer: Medicare Other | Attending: Physical Medicine & Rehabilitation | Admitting: Physical Medicine & Rehabilitation

## 2011-10-29 ENCOUNTER — Encounter: Payer: Self-pay | Admitting: Physical Medicine & Rehabilitation

## 2011-10-29 VITALS — BP 116/67 | HR 72 | Resp 16 | Ht 64.0 in | Wt 136.4 lb

## 2011-10-29 DIAGNOSIS — M7989 Other specified soft tissue disorders: Secondary | ICD-10-CM | POA: Insufficient documentation

## 2011-10-29 DIAGNOSIS — M25579 Pain in unspecified ankle and joints of unspecified foot: Secondary | ICD-10-CM | POA: Insufficient documentation

## 2011-10-29 DIAGNOSIS — R062 Wheezing: Secondary | ICD-10-CM | POA: Insufficient documentation

## 2011-10-29 DIAGNOSIS — M25476 Effusion, unspecified foot: Secondary | ICD-10-CM | POA: Insufficient documentation

## 2011-10-29 DIAGNOSIS — Z9889 Other specified postprocedural states: Secondary | ICD-10-CM | POA: Insufficient documentation

## 2011-10-29 DIAGNOSIS — M19079 Primary osteoarthritis, unspecified ankle and foot: Secondary | ICD-10-CM

## 2011-10-29 DIAGNOSIS — G894 Chronic pain syndrome: Secondary | ICD-10-CM | POA: Insufficient documentation

## 2011-10-29 DIAGNOSIS — M109 Gout, unspecified: Secondary | ICD-10-CM | POA: Insufficient documentation

## 2011-10-29 DIAGNOSIS — M25473 Effusion, unspecified ankle: Secondary | ICD-10-CM | POA: Insufficient documentation

## 2011-10-29 DIAGNOSIS — M1711 Unilateral primary osteoarthritis, right knee: Secondary | ICD-10-CM

## 2011-10-29 DIAGNOSIS — R071 Chest pain on breathing: Secondary | ICD-10-CM | POA: Insufficient documentation

## 2011-10-29 DIAGNOSIS — G8912 Acute post-thoracotomy pain: Secondary | ICD-10-CM

## 2011-10-29 DIAGNOSIS — R300 Dysuria: Secondary | ICD-10-CM | POA: Insufficient documentation

## 2011-10-29 DIAGNOSIS — M1710 Unilateral primary osteoarthritis, unspecified knee: Secondary | ICD-10-CM

## 2011-10-29 DIAGNOSIS — R0789 Other chest pain: Secondary | ICD-10-CM

## 2011-10-29 DIAGNOSIS — M19072 Primary osteoarthritis, left ankle and foot: Secondary | ICD-10-CM

## 2011-10-29 DIAGNOSIS — R197 Diarrhea, unspecified: Secondary | ICD-10-CM | POA: Insufficient documentation

## 2011-10-29 DIAGNOSIS — G629 Polyneuropathy, unspecified: Secondary | ICD-10-CM

## 2011-10-29 DIAGNOSIS — G609 Hereditary and idiopathic neuropathy, unspecified: Secondary | ICD-10-CM

## 2011-10-29 DIAGNOSIS — G252 Other specified forms of tremor: Secondary | ICD-10-CM | POA: Insufficient documentation

## 2011-10-29 DIAGNOSIS — M25569 Pain in unspecified knee: Secondary | ICD-10-CM | POA: Insufficient documentation

## 2011-10-29 DIAGNOSIS — G250 Essential tremor: Secondary | ICD-10-CM | POA: Insufficient documentation

## 2011-10-29 MED ORDER — FENTANYL 25 MCG/HR TD PT72: 1.0000 | MEDICATED_PATCH | TRANSDERMAL | Status: DC

## 2011-10-29 MED ORDER — DICLOFENAC SODIUM 1 % TD GEL: 1.0000 "application " | Freq: Four times a day (QID) | TRANSDERMAL | Status: DC

## 2011-10-29 MED ORDER — HYDROCODONE-ACETAMINOPHEN 5-500 MG PO TABS: 1.0000 | ORAL_TABLET | Freq: Three times a day (TID) | ORAL | Status: DC

## 2011-10-29 NOTE — Progress Notes (Signed)
Subjective:    Patient ID: [name], female    DOB: 28-Mar-1935, 77 y.o.   MRN: [id number]  HPI [name] is back regarding her pain. She states that her left ankle is bothering her more, especially in the evening hours. The pain is associated with the level of activity sh'es involved in. The lidoderm patch helps somewhat, but it's effect seems to be waning.  She has generalized leg swelling, but doesn't notice anymore at the specific painful area. She doesn't notice any bruising, discoloration, or warmth.  She's off her colchicine currently but takes her allopurinol.   No major medical issues or changes in her meds have been noted otherwise.  She does feel more forgetful at times, and she does complain about fatigue.    Pain Inventory Average Pain 6 Pain Right Now 7 My pain is intermittent, sharp, burning, dull, stabbing and aching  In the last 24 hours, has pain interfered with the following? General activity 6 Relation with others 3 Enjoyment of life 5 What TIME of day is your pain at its worst? evening and night Sleep (in general) Good  Pain is worse with: walking, bending, standing, some activites and weather Pain improves with: rest and medication Relief from Meds: 6  Mobility walk without assistance use a cane use a walker how many minutes can you walk? 5 ability to climb steps?  yes do you drive?  yes Do you have any goals in this area?  yes  Function retired I need assistance with the following:  meal prep, household duties and shopping Do you have any goals in this area?  yes  Neuro/Psych bladder control problems bowel control problems weakness numbness tremor tingling trouble walking spasms dizziness confusion anxiety  Prior Studies Any changes since last visit?  no  Physicians involved in your care Any changes since last visit?  no     Review of Systems  Respiratory: Positive for wheezing.   Cardiovascular: Positive for  leg swelling.  Gastrointestinal: Positive for diarrhea.  Genitourinary: Positive for dysuria and difficulty urinating.  All other systems reviewed and are negative.       Objective:   Physical Exam  Constitutional: She is oriented to person, place, and time. She appears well-developed and well-nourished.  HENT:  Head: Normocephalic and atraumatic.  Nose: Nose normal.  Mouth/Throat: Oropharynx is clear and moist.  Eyes: Conjunctivae and EOM are normal. Pupils are equal, round, and reactive to light.  Neck: Normal range of motion. Neck supple.  Cardiovascular: Normal rate and regular rhythm.  Exam reveals no friction rub.   No murmur heard. Pulmonary/Chest: Effort normal and breath sounds normal. No respiratory distress. She has no wheezes.  Abdominal: Soft. She exhibits no distension. There is no tenderness.  Musculoskeletal:       Left ankle is tender at the medial talo tibial joint with signficant swelling and tenderness to palpation and with pronation of the foot. She has difficulty weight bearing as well.   Neurological: She is alert and oriented to person, place, and time. A sensory deficit (bilateral stocking glove sensory loss to knees) is present.  Reflex Scores:      Tricep reflexes are 1+ on the right side and 1+ on the left side.      Bicep reflexes are 1+ on the right side and 1+ on the left side.      Brachioradialis reflexes are 1+ on the right side and 1+ on the left side.      Patellar  reflexes are 1+ on the right side and 1+ on the left side.      Achilles reflexes are 1+ on the right side and 1+ on the left side.      Preserved motor function in both legs except for pain inhibition.           Assessment & Plan:  ASSESSMENT:  1. History of VATS for right lung cancer.  2. Chronic pain syndrome related to her chest wall, right knee and  left ankle areas.  3. Intentional tremor right greater than left  4. Mild peripheral neuropathy  5. Medial ankle pain.  ?post-traumatic arthritis vs gout flair PLAN:  1. Will continue with fentanyl patches at . A second script was provided for next month.  2. Can try voltaren gel for left ankle 3. Check xrays of the left ankle as well as uric acid level today  4. Refill hydrocodone as needed  5. I will see her back in 2 months or sooner if needed. She was given 2 fentanyl  prescriptions today.

## 2011-10-31 ENCOUNTER — Telehealth: Payer: Self-pay | Admitting: Physical Medicine & Rehabilitation

## 2011-10-31 NOTE — Telephone Encounter (Signed)
XRAY OF THE ANKLE SHOWED ADVANCED DEGENERATIVE CHANGES.  WOULD CONTINUE WITH COSERVATIVE MANAGEMENT FOR NOW. URIC ACID LEVEL WAS OK.  SHE MAY WANT TO TRY A NEOPRENE ANKLE SLEEVE TO HELP CONTROL EDEMA AT THE ANKLE AS WELL AS TO GIVE THE ANKLE MORE SUPPORT.---ZTS

## 2011-10-31 NOTE — Telephone Encounter (Signed)
Informed pt of xray results 

## 2011-12-22 ENCOUNTER — Encounter: Payer: Medicare Other | Attending: Physical Medicine & Rehabilitation | Admitting: Physical Medicine & Rehabilitation

## 2011-12-22 ENCOUNTER — Encounter: Payer: Self-pay | Admitting: Physical Medicine & Rehabilitation

## 2011-12-22 VITALS — HR 74 | Resp 14 | Ht 63.0 in | Wt 133.0 lb

## 2011-12-22 DIAGNOSIS — G8912 Acute post-thoracotomy pain: Secondary | ICD-10-CM | POA: Insufficient documentation

## 2011-12-22 DIAGNOSIS — M19079 Primary osteoarthritis, unspecified ankle and foot: Secondary | ICD-10-CM | POA: Insufficient documentation

## 2011-12-22 DIAGNOSIS — M109 Gout, unspecified: Secondary | ICD-10-CM

## 2011-12-22 DIAGNOSIS — R0789 Other chest pain: Secondary | ICD-10-CM

## 2011-12-22 DIAGNOSIS — G629 Polyneuropathy, unspecified: Secondary | ICD-10-CM

## 2011-12-22 DIAGNOSIS — M19072 Primary osteoarthritis, left ankle and foot: Secondary | ICD-10-CM

## 2011-12-22 DIAGNOSIS — G609 Hereditary and idiopathic neuropathy, unspecified: Secondary | ICD-10-CM

## 2011-12-22 DIAGNOSIS — M1711 Unilateral primary osteoarthritis, right knee: Secondary | ICD-10-CM

## 2011-12-22 DIAGNOSIS — IMO0002 Reserved for concepts with insufficient information to code with codable children: Secondary | ICD-10-CM | POA: Insufficient documentation

## 2011-12-22 DIAGNOSIS — M1710 Unilateral primary osteoarthritis, unspecified knee: Secondary | ICD-10-CM

## 2011-12-22 DIAGNOSIS — G8918 Other acute postprocedural pain: Secondary | ICD-10-CM

## 2011-12-22 MED ORDER — LIDOCAINE 5 % EX PTCH: 2.0000 | MEDICATED_PATCH | CUTANEOUS | Status: AC

## 2011-12-22 MED ORDER — LIDOCAINE 5 % EX PTCH: 2.0000 | MEDICATED_PATCH | CUTANEOUS | Status: DC

## 2011-12-22 MED ORDER — FENTANYL 25 MCG/HR TD PT72: 1.0000 | MEDICATED_PATCH | TRANSDERMAL | Status: DC

## 2011-12-22 MED ORDER — HYDROCODONE-ACETAMINOPHEN 5-500 MG PO TABS: 1.0000 | ORAL_TABLET | Freq: Three times a day (TID) | ORAL | Status: DC

## 2011-12-22 NOTE — Patient Instructions (Signed)
You need to wear shoes with better insole cushioning and support.

## 2011-12-22 NOTE — Progress Notes (Signed)
Subjective:    Patient ID: [name], female    DOB: 08-15-1934, 77 y.o.   MRN: [id number]  HPI  [name] is back regarding her chronic pain. She had a bladder sling surgery and rectocele repair about 3 weeks ago by urology. She reports ongoing bleeding which is bright red, and she's not sure of the source.  Her INR was 2.8 last week but her dose wasn't changed. He incontinence pad has been increasingly saturated with blood.   After I saw her last time her knee became painful and she actually developed a full-blown case of gout. Her uric acid was normal when i checked last time, and i never heard back from her. I reviewed her x-ray today which shows degeneration particularly around the medial malleolus and talus. She couldn't find a neoprene ankle sleeve.  She states that the lidoderm patches are the only things which really helped her ankle/joints, but she cannot afford them. The lidoderm gel just didn't have the same effect.   She remains on her fentanyl patch and hydrocodone for her base pain control. She's taken a few more hydrocodone lately due the recent surgery.   Pain Inventory Average Pain 6 Pain Right Now 3 My pain is stabbing  In the last 24 hours, has pain interfered with the following? General activity 8 Relation with others 7 Enjoyment of life 7 What TIME of day is your pain at its worst? evening Sleep (in general) Fair  Pain is worse with: walking and standing Pain improves with: rest and medication Relief from Meds: 6  Mobility use a cane ability to climb steps?  yes do you drive?  yes  Function not employed: date last employed  Do you have any goals in this area?  no  Neuro/Psych bladder control problems bowel control problems  Prior Studies Any changes since last visit?  no  Physicians involved in your care Any changes since last visit?  no   Family History  Problem Relation Age of Onset  . Alzheimer's disease Mother   . Lung disease Mother    . Hypertension Father   . Heart disease Father    History   Social History  . Marital Status: Married    Spouse Name: N/A    Number of Children: N/A  . Years of Education: N/A   Occupational History  . retired    Social History Main Topics  . Smoking status: Never Smoker   . Smokeless tobacco: Never Used  . Alcohol Use: No  . Drug Use: No  . Sexually Active: None   Other Topics Concern  . None   Social History Narrative   Education college degree   Past Surgical History  Procedure Date  . Cholecystectomy 2007  . Appendectomy   . Abdominal hysterectomy     age 24  . Tonsillectomy   . Joint replacement     knee   . Lung lobectomy     1 &1/2 lobes right   Past Medical History  Diagnosis Date  . Depression   . IDDM (insulin dependent diabetes mellitus)   . Hypertension   . Osteoporosis   . Cancer     adenocarcinoma  lung  . Hyperlipidemia   . Renal insufficiency   . Sleep apnea     CPAP  . Atrial fibrillation     ABLATION AT [facility] 2000  . Ulcer     Gastric   Pulse 74  Resp 14  Ht 5\' 3"  (1.6 m)  Wt 133 lb (60.328 kg)  BMI 23.56 kg/m2  SpO2 99%     Review of Systems  Gastrointestinal: Positive for abdominal pain.  Musculoskeletal: Positive for gait problem.  All other systems reviewed and are negative.       Objective:   Physical Exam Constitutional: She is oriented to person, place, and time. She appears well-developed and well-nourished.  HENT:  Head: Normocephalic and atraumatic.  Nose: Nose normal.  Mouth/Throat: Oropharynx is clear and moist.  Eyes: Conjunctivae and EOM are normal. Pupils are equal, round, and reactive to light.  Neck: Normal range of motion. Neck supple.  Cardiovascular: Normal rate and regular rhythm. Exam reveals no friction rub.  No murmur heard.  Pulmonary/Chest: Effort normal and breath sounds normal. No respiratory distress. She has no wheezes.  Abdominal: Soft. She exhibits no distension. There is no  tenderness.  Musculoskeletal:  Left ankle is tender at the medial talo tibial joint with signficant swelling and tenderness to palpation and with pronation of the foot. She has difficulty weight bearing as well.  There is tenderness still at the navicular tubercle. There is 1+ to 2+ edema over the left ankle and to the mid calf area.  Neurological: She is alert and oriented to person, place, and time. A sensory deficit (bilateral stocking glove sensory loss to knees) is present.  Reflex Scores:  Tricep reflexes are 1+ on the right side and 1+ on the left side.  Bicep reflexes are 1+ on the right side and 1+ on the left side.  Brachioradialis reflexes are 1+ on the right side and 1+ on the left side.  Patellar reflexes are 1+ on the right side and 1+ on the left side.  Achilles reflexes are 1+ on the right side and 1+ on the left side. Preserved motor function in both legs except for pain inhibition.  Assessment & Plan:   ASSESSMENT:  1. History of VATS for right lung cancer.  2. Chronic pain syndrome related to her chest wall, right knee and  left ankle areas.  3. Intentional tremor right greater than left  4. Mild peripheral neuropathy  5. Medial ankle pain. ?post-traumatic arthritis vs gout flair  PLAN:  1. Will continue with fentanyl patches at . A second script was provided for next month.  2. Voltaren gel did not work. Wrote scripts for the lidoderm patches and she will attempt to get financial assist with these due to the extreme cost. May look at an anticonvulsant as well for her chest wall and leg pain.  3. Neoprene knee sleeve may help her edema and left ankle stability. An orthotic prescription was written for this today. She also needs to look at wearing appropriate shoes. I recommended  simple running shoes or a three-quarter length shoe. She needs to have something with a cushioned insole and adequate medial arch support. Additionally I recommended followup with her orthopedic  surgeon. 4. Refill hydrocodone as needed. I increased the number to #75 for now. She was given a refill for next month. 5. I will have my PA see her  back in 2 months. She was given 2 fentanyl  prescriptions today. All questions were encouraged and answered. Additionally, we discussed her bleeding issues have recommended that she follow up with her family doctor who she sees today. She may need another urological followup appointment. She certainly needs INR checked with the bleeding history she presented.Marland Kitchen

## 2011-12-23 ENCOUNTER — Other Ambulatory Visit: Payer: Self-pay | Admitting: Physical Medicine & Rehabilitation

## 2011-12-29 ENCOUNTER — Ambulatory Visit: Payer: Medicare Other | Admitting: Physical Medicine & Rehabilitation

## 2012-02-24 ENCOUNTER — Encounter
Payer: Medicare Other | Attending: Physical Medicine and Rehabilitation | Admitting: Physical Medicine and Rehabilitation

## 2012-02-24 ENCOUNTER — Encounter: Payer: Self-pay | Admitting: Physical Medicine and Rehabilitation

## 2012-02-24 VITALS — BP 148/88 | HR 93 | Resp 12 | Ht 63.0 in | Wt 131.0 lb

## 2012-02-24 DIAGNOSIS — G252 Other specified forms of tremor: Secondary | ICD-10-CM | POA: Insufficient documentation

## 2012-02-24 DIAGNOSIS — M1711 Unilateral primary osteoarthritis, right knee: Secondary | ICD-10-CM

## 2012-02-24 DIAGNOSIS — Z85118 Personal history of other malignant neoplasm of bronchus and lung: Secondary | ICD-10-CM | POA: Insufficient documentation

## 2012-02-24 DIAGNOSIS — M19072 Primary osteoarthritis, left ankle and foot: Secondary | ICD-10-CM

## 2012-02-24 DIAGNOSIS — E785 Hyperlipidemia, unspecified: Secondary | ICD-10-CM | POA: Insufficient documentation

## 2012-02-24 DIAGNOSIS — G629 Polyneuropathy, unspecified: Secondary | ICD-10-CM

## 2012-02-24 DIAGNOSIS — M1710 Unilateral primary osteoarthritis, unspecified knee: Secondary | ICD-10-CM

## 2012-02-24 DIAGNOSIS — M12579 Traumatic arthropathy, unspecified ankle and foot: Secondary | ICD-10-CM | POA: Insufficient documentation

## 2012-02-24 DIAGNOSIS — I10 Essential (primary) hypertension: Secondary | ICD-10-CM | POA: Insufficient documentation

## 2012-02-24 DIAGNOSIS — Z96659 Presence of unspecified artificial knee joint: Secondary | ICD-10-CM | POA: Insufficient documentation

## 2012-02-24 DIAGNOSIS — G609 Hereditary and idiopathic neuropathy, unspecified: Secondary | ICD-10-CM | POA: Insufficient documentation

## 2012-02-24 DIAGNOSIS — M19079 Primary osteoarthritis, unspecified ankle and foot: Secondary | ICD-10-CM

## 2012-02-24 DIAGNOSIS — M25579 Pain in unspecified ankle and joints of unspecified foot: Secondary | ICD-10-CM | POA: Insufficient documentation

## 2012-02-24 DIAGNOSIS — G894 Chronic pain syndrome: Secondary | ICD-10-CM | POA: Insufficient documentation

## 2012-02-24 DIAGNOSIS — G4730 Sleep apnea, unspecified: Secondary | ICD-10-CM | POA: Insufficient documentation

## 2012-02-24 DIAGNOSIS — M25572 Pain in left ankle and joints of left foot: Secondary | ICD-10-CM

## 2012-02-24 DIAGNOSIS — G250 Essential tremor: Secondary | ICD-10-CM | POA: Insufficient documentation

## 2012-02-24 MED ORDER — FENTANYL 25 MCG/HR TD PT72: 1.0000 | MEDICATED_PATCH | TRANSDERMAL | Status: DC

## 2012-02-24 NOTE — Patient Instructions (Signed)
Try to soak your ankle in warm water with epson salt for comfort, and try to move your ankle in the water.

## 2012-02-24 NOTE — Progress Notes (Signed)
Subjective:    Patient ID: [name], female    DOB: 1934-09-01, 77 y.o.   MRN: [id number]  HPI The patient complains about chronic pain in her left ankle. She states, that she was diagnosed with osteoarthritis in her left ankle, with Hx of ankle fracture in 1997.  The problem has improved. She states, that she is doing well today, she reports, that she now walks without a cane most of the time. She also reports, that she was diagnosed with some cysts on her thyroid and pancreas, which will be evaluated further in the next 2 weeks. Pain Inventory Average Pain 3 Pain Right Now 4 My pain is constant, sharp, stabbing and aching  In the last 24 hours, has pain interfered with the following? General activity 3 Relation with others 2 Enjoyment of life 3 What TIME of day is your pain at its worst? evening Sleep (in general) Fair  Pain is worse with: walking, standing and some activites Pain improves with: rest, heat/ice, pacing activities and medication Relief from Meds: 5  Mobility walk without assistance use a cane ability to climb steps?  yes do you drive?  yes  Function retired I need assistance with the following:  household duties and shopping  Neuro/Psych weakness tremor dizziness  Prior Studies Any changes since last visit?  no  Physicians involved in your care Any changes since last visit?  no   Family History  Problem Relation Age of Onset  . Alzheimer's disease Mother   . Lung disease Mother   . Hypertension Father   . Heart disease Father    History   Social History  . Marital Status: Married    Spouse Name: N/A    Number of Children: N/A  . Years of Education: N/A   Occupational History  . retired    Social History Main Topics  . Smoking status: Never Smoker   . Smokeless tobacco: Never Used  . Alcohol Use: No  . Drug Use: No  . Sexually Active: None   Other Topics Concern  . None   Social History Narrative   Education college  degree   Past Surgical History  Procedure Date  . Cholecystectomy 2007  . Appendectomy   . Abdominal hysterectomy     age 7  . Tonsillectomy   . Joint replacement     knee   . Lung lobectomy     1 &1/2 lobes right   Past Medical History  Diagnosis Date  . Depression   . IDDM (insulin dependent diabetes mellitus)   . Hypertension   . Osteoporosis   . Cancer     adenocarcinoma  lung  . Hyperlipidemia   . Renal insufficiency   . Sleep apnea     CPAP  . Atrial fibrillation     ABLATION AT [facility] 2000  . Ulcer     Gastric   BP 148/88  Pulse 93  Resp 12  Ht 5\' 3"  (1.6 m)  Wt 131 lb (59.421 kg)  BMI 23.21 kg/m2  SpO2 99%      Review of Systems  HENT: Negative.   Eyes: Negative.   Respiratory: Negative.   Cardiovascular: Negative.   Gastrointestinal: Positive for nausea, vomiting, diarrhea and constipation.  Genitourinary: Negative.   Musculoskeletal: Negative.   Skin: Negative.   Neurological: Positive for dizziness, tremors and weakness.  Hematological: Negative.   Psychiatric/Behavioral: Negative.        Objective:   Physical Exam  Constitutional: She is oriented  to person, place, and time. She appears well-developed and well-nourished.  HENT:  Head: Normocephalic.  Neck: Neck supple.  Musculoskeletal: She exhibits tenderness.  Neurological: She is alert and oriented to person, place, and time.  Skin: Skin is warm and dry.  Psychiatric: She has a normal mood and affect.    Symmetric normal motor tone is noted throughout. Normal muscle bulk. Muscle testing reveals 5/5 muscle strength of the upper extremity, and 5/5 of the lower extremity. Full range of motion in upper and lower extremities, except left ankle dorsal extension is 90/0 degrees, plantar flex is 20 degrees. ROM of spine is restricted. Fine motor movements are normal in both hands.  DTR in the upper and lower extremity are present and symmetric 2+, except left patella reflex is 1+. No clonus  is noted.  Patient arises from chair without difficulty. Narrow based gait with normal arm swing bilateral . A sensory deficit (bilateral stocking glove sensory loss to knees) is present.         Assessment & Plan:  1. History of VATS for right lung cancer.  2. Chronic pain syndrome related to her chest wall, right knee and  left ankle areas. Today she is only complaining about her left ankle.  3. Intentional tremor right greater than left  4. Mild peripheral neuropathy  5. Medial ankle pain on the left,post-traumatic arthritis .  PLAN:  1. Will continue with fentanyl patches at . A second script was provided for next month.  2. Voltaren gel did not work. Patient can not afford the lidoderm patches, which have helped in the past.   3. Neoprene knee sleeve, did not give her any relief. She also needs to look at wearing appropriate shoes, or, I recommended, an ankle brace. 4. Refill hydrocodone as needed,  #75 for now.  5. back in 2 months. She was given 2 fentanyl  prescriptions today.

## 2012-04-23 ENCOUNTER — Encounter: Payer: Medicare Other | Admitting: Physical Medicine & Rehabilitation

## 2012-04-30 ENCOUNTER — Telehealth: Payer: Self-pay | Admitting: Physical Medicine & Rehabilitation

## 2012-05-03 ENCOUNTER — Encounter: Payer: Medicare Other | Admitting: Physical Medicine and Rehabilitation

## 2012-05-05 ENCOUNTER — Encounter: Payer: Self-pay | Admitting: Physical Medicine and Rehabilitation

## 2012-05-05 ENCOUNTER — Encounter
Payer: Medicare Other | Attending: Physical Medicine and Rehabilitation | Admitting: Physical Medicine and Rehabilitation

## 2012-05-05 VITALS — BP 134/73 | HR 73 | Resp 14 | Ht 63.0 in | Wt 132.0 lb

## 2012-05-05 DIAGNOSIS — Z5181 Encounter for therapeutic drug level monitoring: Secondary | ICD-10-CM

## 2012-05-05 DIAGNOSIS — M1711 Unilateral primary osteoarthritis, right knee: Secondary | ICD-10-CM

## 2012-05-05 DIAGNOSIS — Z85118 Personal history of other malignant neoplasm of bronchus and lung: Secondary | ICD-10-CM | POA: Insufficient documentation

## 2012-05-05 DIAGNOSIS — M19079 Primary osteoarthritis, unspecified ankle and foot: Secondary | ICD-10-CM

## 2012-05-05 DIAGNOSIS — G629 Polyneuropathy, unspecified: Secondary | ICD-10-CM

## 2012-05-05 DIAGNOSIS — G609 Hereditary and idiopathic neuropathy, unspecified: Secondary | ICD-10-CM | POA: Insufficient documentation

## 2012-05-05 DIAGNOSIS — M1710 Unilateral primary osteoarthritis, unspecified knee: Secondary | ICD-10-CM

## 2012-05-05 DIAGNOSIS — R071 Chest pain on breathing: Secondary | ICD-10-CM | POA: Insufficient documentation

## 2012-05-05 DIAGNOSIS — G894 Chronic pain syndrome: Secondary | ICD-10-CM | POA: Insufficient documentation

## 2012-05-05 DIAGNOSIS — M12579 Traumatic arthropathy, unspecified ankle and foot: Secondary | ICD-10-CM | POA: Insufficient documentation

## 2012-05-05 DIAGNOSIS — M25579 Pain in unspecified ankle and joints of unspecified foot: Secondary | ICD-10-CM | POA: Insufficient documentation

## 2012-05-05 DIAGNOSIS — G250 Essential tremor: Secondary | ICD-10-CM | POA: Insufficient documentation

## 2012-05-05 DIAGNOSIS — M25569 Pain in unspecified knee: Secondary | ICD-10-CM

## 2012-05-05 DIAGNOSIS — M19072 Primary osteoarthritis, left ankle and foot: Secondary | ICD-10-CM

## 2012-05-05 MED ORDER — FENTANYL 25 MCG/HR TD PT72: 1.0000 | MEDICATED_PATCH | TRANSDERMAL | Status: DC

## 2012-05-05 MED ORDER — HYDROCODONE-ACETAMINOPHEN 5-500 MG PO TABS: 1.0000 | ORAL_TABLET | Freq: Three times a day (TID) | ORAL | Status: DC

## 2012-05-05 NOTE — Patient Instructions (Signed)
You could try Arnica cream for your joint and muscle pain. 

## 2012-05-05 NOTE — Progress Notes (Signed)
Subjective:    Patient ID: [name], female    DOB: 1934-10-12, 77 y.o.   MRN: [id number]  HPI The patient complains about chronic pain in her left ankle. She states, that she was diagnosed with osteoarthritis in her left ankle, with Hx of ankle fracture in 1997.  The problem has improved. She states, that she is doing well today, she reports, that she now walks without a cane most of the time. She also reports, that she was diagnosed with some cysts on her thyroid and pancreas, which she will be following up on , in the next couple days.  Pain Inventory Average Pain 3 Pain Right Now 2 My pain is sharp, stabbing and aching  In the last 24 hours, has pain interfered with the following? General activity 3 Relation with others 2 Enjoyment of life 3 What TIME of day is your pain at its worst? evening Sleep (in general) Fair  Pain is worse with: walking, standing and some activites Pain improves with: rest, heat/ice, pacing activities and medication Relief from Meds: 5  Mobility walk without assistance use a cane ability to climb steps?  yes do you drive?  yes Do you have any goals in this area?  yes  Function retired I need assistance with the following:  household duties Do you have any goals in this area?  yes  Neuro/Psych bladder control problems weakness tremor dizziness  Prior Studies Any changes since last visit?  no  Physicians involved in your care Any changes since last visit?  no   Family History  Problem Relation Age of Onset  . Alzheimer's disease Mother   . Lung disease Mother   . Hypertension Father   . Heart disease Father    History   Social History  . Marital Status: Married    Spouse Name: N/A    Number of Children: N/A  . Years of Education: N/A   Occupational History  . retired    Social History Main Topics  . Smoking status: Never Smoker   . Smokeless tobacco: Never Used  . Alcohol Use: No  . Drug Use: No  . Sexually  Active: None   Other Topics Concern  . None   Social History Narrative   Education college degree   Past Surgical History  Procedure Date  . Cholecystectomy 2007  . Appendectomy   . Abdominal hysterectomy     age 82  . Tonsillectomy   . Joint replacement     knee   . Lung lobectomy     1 &1/2 lobes right   Past Medical History  Diagnosis Date  . Depression   . IDDM (insulin dependent diabetes mellitus)   . Hypertension   . Osteoporosis   . Cancer     adenocarcinoma  lung  . Hyperlipidemia   . Renal insufficiency   . Sleep apnea     CPAP  . Atrial fibrillation     ABLATION AT [facility] 2000  . Ulcer     Gastric   BP 134/73  Pulse 73  Resp 14  Ht 5\' 3"  (1.6 m)  Wt 132 lb (59.875 kg)  BMI 23.38 kg/m2  SpO2 98%    Review of Systems  Respiratory: Positive for wheezing.   Gastrointestinal: Positive for nausea, vomiting and diarrhea.  Musculoskeletal:       Ankle pain  Neurological: Positive for dizziness, tremors and weakness.  All other systems reviewed and are negative.       Objective:  Physical Exam Constitutional: She is oriented to person, place, and time. She appears well-developed and well-nourished.  HENT:  Head: Normocephalic.  Neck: Neck supple.  Musculoskeletal: She exhibits tenderness.  Neurological: She is alert and oriented to person, place, and time.  Skin: Skin is warm and dry.  Psychiatric: She has a normal mood and affect.   Symmetric normal motor tone is noted throughout. Normal muscle bulk. Muscle testing reveals 5/5 muscle strength of the upper extremity, and 5/5 of the lower extremity. Full range of motion in upper and lower extremities, except left ankle dorsal extension is 90/0 degrees, plantar flex is 20 degrees. ROM of spine is restricted. Fine motor movements are normal in both hands.  DTR in the upper and lower extremity are present and symmetric 2+, except left patella reflex is 1+. No clonus is noted.  Patient arises from  chair without difficulty. Narrow based gait with normal arm swing bilateral .  A sensory deficit (bilateral stocking glove sensory loss to knees) is present.         Assessment & Plan:  . History of VATS for right lung cancer.  2. Chronic pain syndrome related to her chest wall, right knee and  left ankle areas. Today she is only complaining about her left ankle.  3. Intentional tremor right greater than left  4. Mild peripheral neuropathy  5. Medial ankle pain on the left,post-traumatic arthritis .  PLAN:  1. Will continue with fentanyl patches at . A second script was provided for next month.  2. Voltaren gel did not work. Patient can not afford the lidoderm patches, which have helped in the past.Recommended to patient to try Arnica cream for her joint and muscle pain. 3. Neoprene knee sleeve, did not give her any relief. She also needs to look at wearing appropriate shoes, or, I recommended, an ankle brace.  4. Refill hydrocodone as needed, #75 for now.  5. back in 2 months. She was given 2 fentanyl  prescriptions today. Patient did not bring pill bottles, because they were empty, we called pharmacy, and they verified, that patient should be out of the meds by now. Educated patient that she has to bring her bottles every time, regardless whether they are empty or not. Patient understood. Refilled meds, patient is 77, , fragile, and has a long way to drive.

## 2012-05-11 NOTE — Telephone Encounter (Signed)
Error

## 2012-07-05 ENCOUNTER — Encounter: Payer: Self-pay | Admitting: Physical Medicine and Rehabilitation

## 2012-07-05 ENCOUNTER — Encounter
Payer: Medicare Other | Attending: Physical Medicine and Rehabilitation | Admitting: Physical Medicine and Rehabilitation

## 2012-07-05 VITALS — BP 130/67 | HR 86 | Resp 14 | Ht 63.0 in | Wt 138.0 lb

## 2012-07-05 DIAGNOSIS — M1250 Traumatic arthropathy, unspecified site: Secondary | ICD-10-CM | POA: Insufficient documentation

## 2012-07-05 DIAGNOSIS — G894 Chronic pain syndrome: Secondary | ICD-10-CM | POA: Insufficient documentation

## 2012-07-05 DIAGNOSIS — M19079 Primary osteoarthritis, unspecified ankle and foot: Secondary | ICD-10-CM

## 2012-07-05 DIAGNOSIS — M1711 Unilateral primary osteoarthritis, right knee: Secondary | ICD-10-CM

## 2012-07-05 DIAGNOSIS — M19072 Primary osteoarthritis, left ankle and foot: Secondary | ICD-10-CM

## 2012-07-05 DIAGNOSIS — R079 Chest pain, unspecified: Secondary | ICD-10-CM | POA: Insufficient documentation

## 2012-07-05 DIAGNOSIS — M25579 Pain in unspecified ankle and joints of unspecified foot: Secondary | ICD-10-CM | POA: Insufficient documentation

## 2012-07-05 DIAGNOSIS — M1710 Unilateral primary osteoarthritis, unspecified knee: Secondary | ICD-10-CM

## 2012-07-05 DIAGNOSIS — M25569 Pain in unspecified knee: Secondary | ICD-10-CM | POA: Insufficient documentation

## 2012-07-05 DIAGNOSIS — G250 Essential tremor: Secondary | ICD-10-CM | POA: Insufficient documentation

## 2012-07-05 DIAGNOSIS — G629 Polyneuropathy, unspecified: Secondary | ICD-10-CM

## 2012-07-05 DIAGNOSIS — G609 Hereditary and idiopathic neuropathy, unspecified: Secondary | ICD-10-CM

## 2012-07-05 DIAGNOSIS — G252 Other specified forms of tremor: Secondary | ICD-10-CM | POA: Insufficient documentation

## 2012-07-05 MED ORDER — HYDROCODONE-ACETAMINOPHEN 5-500 MG PO TABS: 1.0000 | ORAL_TABLET | Freq: Three times a day (TID) | ORAL | Status: DC

## 2012-07-05 MED ORDER — FENTANYL 25 MCG/HR TD PT72: 1.0000 | MEDICATED_PATCH | TRANSDERMAL | Status: DC

## 2012-07-05 NOTE — Patient Instructions (Signed)
Try foot massage with a massage ball, you can also try a feet bath with epson salt.

## 2012-07-05 NOTE — Progress Notes (Signed)
Subjective:    Patient ID: [name], female    DOB: 1934/11/21, 78 y.o.   MRN: [id number]  HPI The patient complains about chronic pain in her left ankle. She states, that she was diagnosed with osteoarthritis in her left ankle, with Hx of ankle fracture in 1997.  The problem has improved. She states, that she is doing well today, she reports, that she now walks without a cane most of the time. She also reports, that she had a fall around thanksgiving, where she had a head injury, she did not loose consciousness, and did not have neurological deficits after. She was evaluated at [facility] ED. She is back at baseline today.  Pain Inventory Average Pain 5 Pain Right Now 7 My pain is constant, sharp, burning, dull, stabbing and aching  In the last 24 hours, has pain interfered with the following? General activity 6 Relation with others 2 Enjoyment of life 5 What TIME of day is your pain at its worst? morning and evening Sleep (in general) Fair  Pain is worse with: walking and some activites Pain improves with: rest and medication Relief from Meds: 6  Mobility walk without assistance how many minutes can you walk? 3-5 ability to climb steps?  yes do you drive?  yes transfers alone Do you have any goals in this area?  yes  Function retired I need assistance with the following:  household duties  Neuro/Psych numbness tremor trouble walking  Prior Studies Any changes since last visit?  no  Physicians involved in your care Any changes since last visit?  no   Family History  Problem Relation Age of Onset  . Alzheimer's disease Mother   . Lung disease Mother   . Hypertension Father   . Heart disease Father    History   Social History  . Marital Status: Married    Spouse Name: N/A    Number of Children: N/A  . Years of Education: N/A   Occupational History  . retired    Social History Main Topics  . Smoking status: Never Smoker   . Smokeless  tobacco: Never Used  . Alcohol Use: No  . Drug Use: No  . Sexually Active: None   Other Topics Concern  . None   Social History Narrative   Education college degree   Past Surgical History  Procedure Date  . Cholecystectomy 2007  . Appendectomy   . Abdominal hysterectomy     age 21  . Tonsillectomy   . Joint replacement     knee   . Lung lobectomy     1 &1/2 lobes right   Past Medical History  Diagnosis Date  . Depression   . IDDM (insulin dependent diabetes mellitus)   . Hypertension   . Osteoporosis   . Cancer     adenocarcinoma  lung  . Hyperlipidemia   . Renal insufficiency   . Sleep apnea     CPAP  . Atrial fibrillation     ABLATION AT [facility] 2000  . Ulcer     Gastric   BP 130/67  Pulse 86  Resp 14  Ht 5\' 3"  (1.6 m)  Wt 138 lb (62.596 kg)  BMI 24.45 kg/m2  SpO2 96%    Review of Systems  Musculoskeletal: Positive for myalgias, arthralgias and gait problem.  Neurological: Positive for tremors and numbness.  All other systems reviewed and are negative.       Objective:   Physical Exam Constitutional: She is  oriented to person, place, and time. She appears well-developed and well-nourished.  HENT:  Head: Normocephalic.  Neck: Neck supple.  Musculoskeletal: She exhibits tenderness.  Neurological: She is alert and oriented to person, place, and time.  Skin: Skin is warm and dry.  Psychiatric: She has a normal mood and affect.  Symmetric normal motor tone is noted throughout. Normal muscle bulk. Muscle testing reveals 5/5 muscle strength of the upper extremity, and 5/5 of the lower extremity. Full range of motion in upper and lower extremities, except left ankle dorsal extension is 90/0 degrees, plantar flex is 20 degrees. ROM of spine is restricted. Fine motor movements are normal in both hands.  DTR in the upper and lower extremity are present and symmetric 2+, except left patella reflex is 1+. No clonus is noted.  Patient arises from chair without  difficulty. Narrow based gait with normal arm swing bilateral .  A sensory deficit (bilateral stocking glove sensory loss to knees) is present.         Assessment & Plan:  . History of VATS for right lung cancer.  2. Chronic pain syndrome related to her chest wall, right knee and  left ankle areas. Today she is only complaining about her left ankle.  3. Intentional tremor right greater than left  4. Mild peripheral neuropathy  5. Medial ankle pain on the left,post-traumatic arthritis .  6. Hx of fall, recommended PT for stability, patient wants to try some exercises at home first. I showed her some exercises she could do in a safe way.Also recommended foot massage and foot bath with epson salt to train her nerve endings if possible. PLAN:  1. Will continue with fentanyl patches at . A second script was provided for next month.  2. Voltaren gel did not work. Patient can not afford the lidoderm patches, which have helped in the past.Recommended to patient to try Arnica cream for her joint and muscle pain.  3. Neoprene knee sleeve, did not give her any relief. She also needs to look at wearing appropriate shoes.  4. Refill hydrocodone as needed, #75 for now.  5. back in 2 months. She was given 2 fentanyl  prescriptions today.   Refilled meds, patient is 77, , fragile, and has a long way to drive.

## 2012-08-11 ENCOUNTER — Telehealth: Payer: Self-pay

## 2012-08-11 NOTE — Telephone Encounter (Signed)
Patient is requesting lidoderm patch refill.  Please advise.

## 2012-08-11 NOTE — Telephone Encounter (Signed)
That would be fine. How many does she need. i don't see it on our list. i'm pretty sure i've presribed before.

## 2012-08-12 MED ORDER — LIDOCAINE 5 % EX PTCH: 1.0000 | MEDICATED_PATCH | CUTANEOUS | Status: DC

## 2012-08-12 NOTE — Telephone Encounter (Signed)
Lidoderm refilled.

## 2012-09-01 ENCOUNTER — Encounter
Payer: Medicare Other | Attending: Physical Medicine and Rehabilitation | Admitting: Physical Medicine and Rehabilitation

## 2012-09-01 ENCOUNTER — Encounter: Payer: Self-pay | Admitting: Physical Medicine and Rehabilitation

## 2012-09-01 DIAGNOSIS — I252 Old myocardial infarction: Secondary | ICD-10-CM | POA: Insufficient documentation

## 2012-09-01 DIAGNOSIS — G4730 Sleep apnea, unspecified: Secondary | ICD-10-CM | POA: Insufficient documentation

## 2012-09-01 DIAGNOSIS — E119 Type 2 diabetes mellitus without complications: Secondary | ICD-10-CM | POA: Insufficient documentation

## 2012-09-01 DIAGNOSIS — M1710 Unilateral primary osteoarthritis, unspecified knee: Secondary | ICD-10-CM

## 2012-09-01 DIAGNOSIS — G609 Hereditary and idiopathic neuropathy, unspecified: Secondary | ICD-10-CM | POA: Insufficient documentation

## 2012-09-01 DIAGNOSIS — M25579 Pain in unspecified ankle and joints of unspecified foot: Secondary | ICD-10-CM | POA: Insufficient documentation

## 2012-09-01 DIAGNOSIS — G894 Chronic pain syndrome: Secondary | ICD-10-CM | POA: Insufficient documentation

## 2012-09-01 DIAGNOSIS — Z96659 Presence of unspecified artificial knee joint: Secondary | ICD-10-CM | POA: Insufficient documentation

## 2012-09-01 DIAGNOSIS — G250 Essential tremor: Secondary | ICD-10-CM | POA: Insufficient documentation

## 2012-09-01 DIAGNOSIS — Z9181 History of falling: Secondary | ICD-10-CM | POA: Insufficient documentation

## 2012-09-01 DIAGNOSIS — M810 Age-related osteoporosis without current pathological fracture: Secondary | ICD-10-CM | POA: Insufficient documentation

## 2012-09-01 DIAGNOSIS — M19079 Primary osteoarthritis, unspecified ankle and foot: Secondary | ICD-10-CM | POA: Insufficient documentation

## 2012-09-01 DIAGNOSIS — G629 Polyneuropathy, unspecified: Secondary | ICD-10-CM

## 2012-09-01 DIAGNOSIS — I10 Essential (primary) hypertension: Secondary | ICD-10-CM | POA: Insufficient documentation

## 2012-09-01 DIAGNOSIS — E785 Hyperlipidemia, unspecified: Secondary | ICD-10-CM | POA: Insufficient documentation

## 2012-09-01 DIAGNOSIS — Z85118 Personal history of other malignant neoplasm of bronchus and lung: Secondary | ICD-10-CM | POA: Insufficient documentation

## 2012-09-01 DIAGNOSIS — M12579 Traumatic arthropathy, unspecified ankle and foot: Secondary | ICD-10-CM | POA: Insufficient documentation

## 2012-09-01 DIAGNOSIS — G252 Other specified forms of tremor: Secondary | ICD-10-CM | POA: Insufficient documentation

## 2012-09-01 DIAGNOSIS — M1711 Unilateral primary osteoarthritis, right knee: Secondary | ICD-10-CM

## 2012-09-01 DIAGNOSIS — M19072 Primary osteoarthritis, left ankle and foot: Secondary | ICD-10-CM

## 2012-09-01 MED ORDER — FENTANYL 25 MCG/HR TD PT72: 1.0000 | MEDICATED_PATCH | TRANSDERMAL | Status: DC

## 2012-09-01 MED ORDER — FENTANYL 25 MCG/HR TD PT72
1.0000 | MEDICATED_PATCH | TRANSDERMAL | Status: DC
Start: 2012-09-01 — End: 2012-09-01

## 2012-09-01 MED ORDER — HYDROCODONE-ACETAMINOPHEN 5-500 MG PO TABS: 1.0000 | ORAL_TABLET | Freq: Three times a day (TID) | ORAL | Status: DC

## 2012-09-01 NOTE — Patient Instructions (Signed)
Follow up with your cardiologist  

## 2012-09-01 NOTE — Progress Notes (Signed)
Subjective:    Patient ID: [name], female    DOB: 08-13-1934, 78 y.o.   MRN: [id number]  HPI The patient complains about chronic pain in her left ankle. She states, that she was diagnosed with osteoarthritis in her left ankle, with Hx of ankle fracture in 1997.  The problem has improved. She states, that she she has less pain in her ankle, because she was not on her feet a lot. She also reports, that she was admitted to [facility] for upper abdominal pain, she states, that she was diagnosed with a MI, and asp. Pneumonia, she is following up with a cardiologist, and her PCP.  Pain Inventory Average Pain 3 Pain Right Now 3 My pain is intermittent, burning and stabbing  In the last 24 hours, has pain interfered with the following? General activity 3 Relation with others 3 Enjoyment of life 3 What TIME of day is your pain at its worst? any time Sleep (in general) Fair  Pain is worse with: walking and standing Pain improves with: rest and medication Relief from Meds: 7  Mobility walk without assistance use a cane ability to climb steps?  yes do you drive?  yes  Function retired I need assistance with the following:  meal prep, household duties and shopping  Neuro/Psych weakness tremor confusion  Prior Studies Any changes since last visit?  no  Physicians involved in your care Any changes since last visit?  no   Family History  Problem Relation Age of Onset  . Alzheimer's disease Mother   . Lung disease Mother   . Hypertension Father   . Heart disease Father    History   Social History  . Marital Status: Married    Spouse Name: N/A    Number of Children: N/A  . Years of Education: N/A   Occupational History  . retired    Social History Main Topics  . Smoking status: Never Smoker   . Smokeless tobacco: Never Used  . Alcohol Use: No  . Drug Use: No  . Sexually Active: None   Other Topics Concern  . None   Social History Narrative   Education college degree   Past Surgical History  Procedure Laterality Date  . Cholecystectomy  2007  . Appendectomy    . Abdominal hysterectomy      age 54  . Tonsillectomy    . Joint replacement      knee   . Lung lobectomy      1 &1/2 lobes right   Past Medical History  Diagnosis Date  . Depression   . IDDM (insulin dependent diabetes mellitus)   . Hypertension   . Osteoporosis   . Cancer     adenocarcinoma  lung  . Hyperlipidemia   . Renal insufficiency   . Sleep apnea     CPAP  . Atrial fibrillation     ABLATION AT [facility] 2000  . Ulcer     Gastric   BP 127/79  Pulse 73  Resp 14  Ht 5\' 3"  (1.6 m)  Wt 139 lb (63.05 kg)  BMI 24.63 kg/m2  SpO2 99%     Review of Systems  Cardiovascular: Positive for leg swelling.  Neurological: Positive for tremors and weakness.  Psychiatric/Behavioral: Positive for confusion.  All other systems reviewed and are negative.       Objective:   Physical Exam Constitutional: She is oriented to person, place, and time. She appears well-developed and well-nourished.  HENT:  Head: Normocephalic.  Neck: Neck supple.  Musculoskeletal: She exhibits tenderness.  Neurological: She is alert and oriented to person, place, and time.  Skin: Skin is warm and dry.  Psychiatric: She has a normal mood and affect.  Symmetric normal motor tone is noted throughout. Normal muscle bulk. Muscle testing reveals 5/5 muscle strength of the upper extremity, and 5/5 of the lower extremity. Full range of motion in upper and lower extremities, except left ankle dorsal extension is 90/0 degrees, plantar flex is 20 degrees. ROM of spine is restricted. Fine motor movements are normal in both hands.  DTR in the upper and lower extremity are present and symmetric 2+, except left patella reflex is 1+. No clonus is noted.  Patient arises from chair without difficulty. Narrow based gait with normal arm swing bilateral .  A sensory deficit (bilateral stocking  glove sensory loss to knees) is present.         Assessment & Plan:  . History of VATS for right lung cancer.  2. Chronic pain syndrome related to her chest wall, right knee and  left ankle areas. Today she is only complaining about her left ankle.  3. Intentional tremor right greater than left  4. Mild peripheral neuropathy  5. Medial ankle pain on the left,post-traumatic arthritis .  6. Hx of fall, recommended PT for stability, patient wants to try some exercises at home first. I showed her some exercises she could do in a safe way.Also recommended foot massage and foot bath with epson salt to train her nerve endings if possible.  7. Admitted to [facility] ED, was diagnosed with MI, per patient, do not have any reports. Patient is following up with cardiology. PLAN:  1. Will continue with fentanyl patches at . A second script was provided for next month.  2. Voltaren gel did not work. Patient can not afford the lidoderm patches, which have helped in the past.Recommended to patient to try Arnica cream for her joint and muscle pain.  3. Neoprene knee sleeve, did not give her any relief. She also needs to look at wearing appropriate shoes.  4. Refill hydrocodone as needed, #75 for now.  5. back in 2 months. She was given 2 fentanyl  prescriptions today.  Refilled meds, patient is 77, , fragile, and has a long way to drive.

## 2012-11-01 ENCOUNTER — Encounter: Payer: Self-pay | Admitting: Physical Medicine and Rehabilitation

## 2012-11-01 ENCOUNTER — Encounter
Payer: Medicare Other | Attending: Physical Medicine and Rehabilitation | Admitting: Physical Medicine and Rehabilitation

## 2012-11-01 VITALS — BP 110/61 | HR 70 | Resp 14 | Ht 63.0 in | Wt 143.0 lb

## 2012-11-01 DIAGNOSIS — IMO0002 Reserved for concepts with insufficient information to code with codable children: Secondary | ICD-10-CM | POA: Insufficient documentation

## 2012-11-01 DIAGNOSIS — G629 Polyneuropathy, unspecified: Secondary | ICD-10-CM

## 2012-11-01 DIAGNOSIS — G609 Hereditary and idiopathic neuropathy, unspecified: Secondary | ICD-10-CM | POA: Insufficient documentation

## 2012-11-01 DIAGNOSIS — Z85118 Personal history of other malignant neoplasm of bronchus and lung: Secondary | ICD-10-CM | POA: Insufficient documentation

## 2012-11-01 DIAGNOSIS — M19072 Primary osteoarthritis, left ankle and foot: Secondary | ICD-10-CM

## 2012-11-01 DIAGNOSIS — X58XXXA Exposure to other specified factors, initial encounter: Secondary | ICD-10-CM | POA: Insufficient documentation

## 2012-11-01 DIAGNOSIS — I252 Old myocardial infarction: Secondary | ICD-10-CM | POA: Insufficient documentation

## 2012-11-01 DIAGNOSIS — G250 Essential tremor: Secondary | ICD-10-CM | POA: Insufficient documentation

## 2012-11-01 DIAGNOSIS — M12579 Traumatic arthropathy, unspecified ankle and foot: Secondary | ICD-10-CM | POA: Insufficient documentation

## 2012-11-01 DIAGNOSIS — Z79899 Other long term (current) drug therapy: Secondary | ICD-10-CM

## 2012-11-01 DIAGNOSIS — G894 Chronic pain syndrome: Secondary | ICD-10-CM | POA: Insufficient documentation

## 2012-11-01 DIAGNOSIS — M1711 Unilateral primary osteoarthritis, right knee: Secondary | ICD-10-CM

## 2012-11-01 DIAGNOSIS — Z9181 History of falling: Secondary | ICD-10-CM | POA: Insufficient documentation

## 2012-11-01 DIAGNOSIS — Z5181 Encounter for therapeutic drug level monitoring: Secondary | ICD-10-CM

## 2012-11-01 DIAGNOSIS — E119 Type 2 diabetes mellitus without complications: Secondary | ICD-10-CM | POA: Insufficient documentation

## 2012-11-01 DIAGNOSIS — M19079 Primary osteoarthritis, unspecified ankle and foot: Secondary | ICD-10-CM | POA: Insufficient documentation

## 2012-11-01 DIAGNOSIS — M1710 Unilateral primary osteoarthritis, unspecified knee: Secondary | ICD-10-CM

## 2012-11-01 MED ORDER — FENTANYL 25 MCG/HR TD PT72: 1.0000 | MEDICATED_PATCH | TRANSDERMAL | Status: DC

## 2012-11-01 MED ORDER — HYDROCODONE-ACETAMINOPHEN 5-500 MG PO TABS: 1.0000 | ORAL_TABLET | Freq: Three times a day (TID) | ORAL | Status: DC

## 2012-11-01 NOTE — Patient Instructions (Addendum)
Call your Podiatrist today, and make an appointment asap. Stay as active as tolerated.

## 2012-11-01 NOTE — Progress Notes (Signed)
Subjective:    Patient ID: [name], female    DOB: March 03, 1935, 78 y.o.   MRN: [id number]  HPI The patient complains about chronic pain in her left ankle. She states, that she was diagnosed with osteoarthritis in her left ankle, with Hx of ankle fracture in 1997.  The problem has improved. She states, that she she has less pain in her ankle, because she was not on her feet a lot. She also reports, that she was admitted to [facility] for upper abdominal pain, she states, that she was diagnosed with a MI, and asp. Pneumonia, she is following up with a cardiologist, and her PCP. She reports that she had Shingles in April. She also reports that she has a blood blister on her right big toe.  Pain Inventory Average Pain 6 Pain Right Now 7 My pain is constant, sharp and stabbing  In the last 24 hours, has pain interfered with the following? General activity 7 Relation with others 8 Enjoyment of life 8 What TIME of day is your pain at its worst? evening Sleep (in general) Fair  Pain is worse with: walking, standing and some activites Pain improves with: medication Relief from Meds: 3  Mobility walk with assistance use a cane how many minutes can you walk? 3-4 ability to climb steps?  yes do you drive?  yes Do you have any goals in this area?  yes  Function retired I need assistance with the following:  meal prep, household duties and shopping Do you have any goals in this area?  no  Neuro/Psych bowel control problems weakness numbness tremor trouble walking dizziness confusion depression anxiety  Prior Studies x-rays CT/MRI  Physicians involved in your care Dr Kristen Loader, Dr Loleta Chance, Dr Bennett Scrape   Family History  Problem Relation Age of Onset  . Alzheimer's disease Mother   . Lung disease Mother   . Hypertension Father   . Heart disease Father    History   Social History  . Marital Status: Married    Spouse Name: N/A    Number of Children: N/A  .  Years of Education: N/A   Occupational History  . retired    Social History Main Topics  . Smoking status: Never Smoker   . Smokeless tobacco: Never Used  . Alcohol Use: No  . Drug Use: No  . Sexually Active: None   Other Topics Concern  . None   Social History Narrative   Education college degree   Past Surgical History  Procedure Laterality Date  . Cholecystectomy  2007  . Appendectomy    . Abdominal hysterectomy      age 31  . Tonsillectomy    . Joint replacement      knee   . Lung lobectomy      1 &1/2 lobes right   Past Medical History  Diagnosis Date  . Depression   . IDDM (insulin dependent diabetes mellitus)   . Hypertension   . Osteoporosis   . Cancer     adenocarcinoma  lung  . Hyperlipidemia   . Renal insufficiency   . Sleep apnea     CPAP  . Atrial fibrillation     ABLATION AT [facility] 2000  . Ulcer     Gastric   BP 110/61  Pulse 70  Resp 14  Ht 5\' 3"  (1.6 m)  Wt 143 lb (64.864 kg)  BMI 25.34 kg/m2  SpO2 97%     Review of Systems  Musculoskeletal: Positive for  gait problem.  Neurological: Positive for dizziness, tremors, weakness and numbness.  Psychiatric/Behavioral: Positive for confusion and dysphoric mood. The patient is nervous/anxious.   All other systems reviewed and are negative.       Objective:   Physical Exam Constitutional: She is oriented to person, place, and time. She appears well-developed and well-nourished.  HENT:  Head: Normocephalic.  Neck: Neck supple.  Musculoskeletal: She exhibits tenderness.  Neurological: She is alert and oriented to person, place, and time.  Skin: Skin is warm and dry. 1 cm blood blister on her right toe, surrounded by redness and increased temperature Psychiatric: She has a normal mood and affect.  Symmetric normal motor tone is noted throughout. Normal muscle bulk. Muscle testing reveals 5/5 muscle strength of the upper extremity, and 5/5 of the lower extremity. Full range of motion in  upper and lower extremities, except left ankle dorsal extension is 90/0 degrees, plantar flex is 20 degrees. ROM of spine is restricted. Fine motor movements are normal in both hands.  DTR in the upper and lower extremity are present and symmetric 2+, except left patella reflex is 1+. No clonus is noted.  Patient arises from chair without difficulty. Narrow based gait with normal arm swing bilateral .  A sensory deficit (bilateral stocking glove sensory loss to knees) is present.         Assessment & Plan:  1. History of VATS for right lung cancer.  2. Chronic pain syndrome related to her chest wall, right knee and  left ankle areas. Today she is only complaining about her left ankle.  3. Intentional tremor right greater than left  4. Mild peripheral neuropathy  5. Medial ankle pain on the left,post-traumatic arthritis .  6. Hx of fall, recommended PT for stability, patient wants to try some exercises at home first. I showed her some exercises she could do in a safe way.Also recommended foot massage and foot bath with epson salt to train her nerve endings if possible.  7. Admitted to [facility] ED, was diagnosed with MI, per patient, do not have any reports. Patient is following up with cardiology.  8.  1 cm blood blister on her right toe, surrounded by redness and increased temperature, advised patient to call her podiatrist today and make an appointment asap, patient has DM PLAN:  1. Will continue with fentanyl patches at . A second script was provided for next month.  2. Voltaren gel did not work. Patient can not afford the lidoderm patches, which have helped in the past.Recommended to patient to try Arnica cream for her joint and muscle pain.  3. Neoprene knee sleeve, did not give her any relief. She also needs to look at wearing appropriate shoes.  4. Refill hydrocodone as needed, #75 for now.  5. back in 2 months, patient is 78, fragile, and has a long way to drive.

## 2012-11-05 ENCOUNTER — Telehealth: Payer: Self-pay | Admitting: *Deleted

## 2012-11-05 DIAGNOSIS — M19072 Primary osteoarthritis, left ankle and foot: Secondary | ICD-10-CM

## 2012-11-05 DIAGNOSIS — M1711 Unilateral primary osteoarthritis, right knee: Secondary | ICD-10-CM

## 2012-11-05 DIAGNOSIS — G629 Polyneuropathy, unspecified: Secondary | ICD-10-CM

## 2012-11-05 MED ORDER — HYDROCODONE-ACETAMINOPHEN 5-325 MG PO TABS: 1.0000 | ORAL_TABLET | Freq: Three times a day (TID) | ORAL | Status: DC | PRN

## 2012-11-05 NOTE — Telephone Encounter (Signed)
Tiffany from [facility] called because [name]'s hydrocodone was ordered 5-500mg  and it is no longer manufactured that way.  Order changed to 5-325mg , and called to pharmacy.

## 2012-12-02 ENCOUNTER — Telehealth: Payer: Self-pay

## 2012-12-02 NOTE — Telephone Encounter (Signed)
Office called wanting to change patient from hydrocodone 5/500 to 5/325 since it is no longer made.  Our records show she is on 5/325 with 1 refill at [facility] as of 11/05/12.  They will inform patient.

## 2012-12-31 ENCOUNTER — Encounter: Payer: Self-pay | Admitting: Physical Medicine and Rehabilitation

## 2012-12-31 ENCOUNTER — Encounter
Payer: Medicare Other | Attending: Physical Medicine and Rehabilitation | Admitting: Physical Medicine and Rehabilitation

## 2012-12-31 ENCOUNTER — Telehealth: Payer: Self-pay

## 2012-12-31 ENCOUNTER — Other Ambulatory Visit: Payer: Self-pay | Admitting: Physical Medicine and Rehabilitation

## 2012-12-31 VITALS — BP 127/69 | HR 90 | Resp 16 | Ht 63.0 in | Wt 149.0 lb

## 2012-12-31 DIAGNOSIS — Z794 Long term (current) use of insulin: Secondary | ICD-10-CM | POA: Insufficient documentation

## 2012-12-31 DIAGNOSIS — M25569 Pain in unspecified knee: Secondary | ICD-10-CM | POA: Insufficient documentation

## 2012-12-31 DIAGNOSIS — Z79899 Other long term (current) drug therapy: Secondary | ICD-10-CM | POA: Insufficient documentation

## 2012-12-31 DIAGNOSIS — G609 Hereditary and idiopathic neuropathy, unspecified: Secondary | ICD-10-CM | POA: Insufficient documentation

## 2012-12-31 DIAGNOSIS — I10 Essential (primary) hypertension: Secondary | ICD-10-CM | POA: Insufficient documentation

## 2012-12-31 DIAGNOSIS — R071 Chest pain on breathing: Secondary | ICD-10-CM | POA: Insufficient documentation

## 2012-12-31 DIAGNOSIS — M1710 Unilateral primary osteoarthritis, unspecified knee: Secondary | ICD-10-CM

## 2012-12-31 DIAGNOSIS — G894 Chronic pain syndrome: Secondary | ICD-10-CM | POA: Insufficient documentation

## 2012-12-31 DIAGNOSIS — G629 Polyneuropathy, unspecified: Secondary | ICD-10-CM

## 2012-12-31 DIAGNOSIS — G250 Essential tremor: Secondary | ICD-10-CM | POA: Insufficient documentation

## 2012-12-31 DIAGNOSIS — C3490 Malignant neoplasm of unspecified part of unspecified bronchus or lung: Secondary | ICD-10-CM | POA: Insufficient documentation

## 2012-12-31 DIAGNOSIS — M19079 Primary osteoarthritis, unspecified ankle and foot: Secondary | ICD-10-CM

## 2012-12-31 DIAGNOSIS — E119 Type 2 diabetes mellitus without complications: Secondary | ICD-10-CM | POA: Insufficient documentation

## 2012-12-31 DIAGNOSIS — M19072 Primary osteoarthritis, left ankle and foot: Secondary | ICD-10-CM

## 2012-12-31 DIAGNOSIS — M12579 Traumatic arthropathy, unspecified ankle and foot: Secondary | ICD-10-CM | POA: Insufficient documentation

## 2012-12-31 DIAGNOSIS — M1711 Unilateral primary osteoarthritis, right knee: Secondary | ICD-10-CM

## 2012-12-31 DIAGNOSIS — Z9181 History of falling: Secondary | ICD-10-CM | POA: Insufficient documentation

## 2012-12-31 IMAGING — CR DG CHEST 1V PORT
1 series · 1 of 1 positions shown · non-contrast
Comparison: Chest radiograph 02/27/2011

CLINICAL DATA: Postop

PORTABLE CHEST - 1 VIEW

[view not recorded]
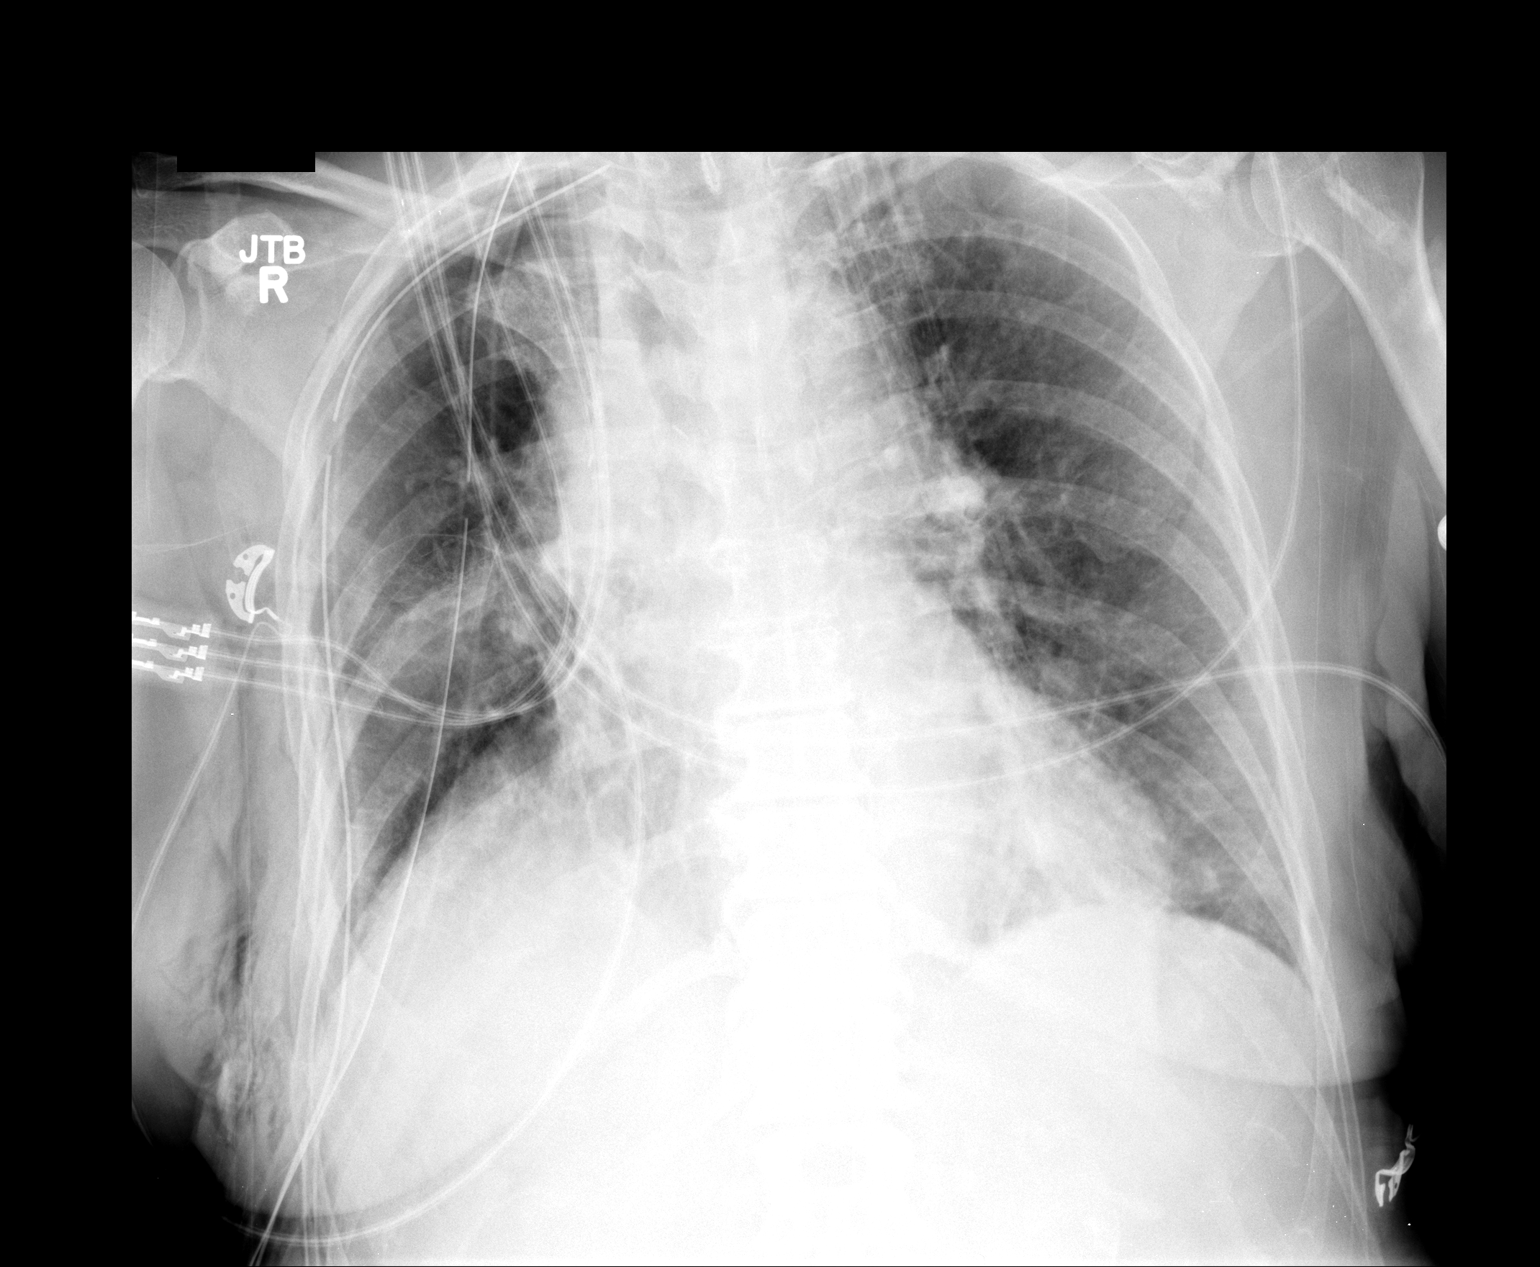

[1 of 1 positions shown; findings below may reference images not displayed]

FINDINGS: Two left-sided chest tubes are in place.  Right central
venous line is in place with tip in distal SVC.  Two right chest
tubes are at the right lung apex.  No evidence of pneumothorax.
There is volume loss in the right hemithorax with mild atelectasis.
Left lung is relatively clear.
IMPRESSION: 1.  Postoperative change in the right hemithorax.
2.  Right chest tubes in place with no pneumothorax evident.

## 2012-12-31 MED ORDER — FENTANYL 25 MCG/HR TD PT72: 1.0000 | MEDICATED_PATCH | TRANSDERMAL | Status: AC

## 2012-12-31 MED ORDER — FENTANYL 25 MCG/HR TD PT72: 1.0000 | MEDICATED_PATCH | TRANSDERMAL | Status: DC

## 2012-12-31 MED ORDER — HYDROCODONE-ACETAMINOPHEN 7.5-325 MG PO TABS: 1.0000 | ORAL_TABLET | Freq: Four times a day (QID) | ORAL | Status: AC | PRN

## 2012-12-31 NOTE — Telephone Encounter (Signed)
Victorino Dike with [facility] called regarding patient.  Tried to contact office but they were not available.

## 2012-12-31 NOTE — Progress Notes (Signed)
Subjective:    Patient ID: [name], female    DOB: Nov 11, 1934, 78 y.o.   MRN: [id number]  HPI The patient complains about chronic pain in her left ankle. She states, that she was diagnosed with osteoarthritis in her left ankle, with Hx of ankle fracture in 1997.  The problem has improved. She states, that she she has less pain in her ankle, because she was not on her feet a lot. She also reports, that she was admitted to [facility] for upper abdominal pain, she states, that she was diagnosed with a MI, and asp. Pneumonia, she is following up with a cardiologist, and her PCP.  She reports that she had Shingles in April, which still is causing her some pain. She also reports that she has a pacemaker placed on her right chest on 12/17/12. She also reports that unfortunately her cancer has come back, she now has stage 4 cancer, cancerous cells where detected in the pleura. Her biopsy was sent to [facility], to find out whether a certain medication could treat her cancer , if so she states that she would maybe have 4-5 more years to live, otherwise 12-24 month.  Pain Inventory Average Pain 7 Pain Right Now 4 My pain is constant, burning and stabbing  In the last 24 hours, has pain interfered with the following? General activity 5 Relation with others 4 Enjoyment of life 5 What TIME of day is your pain at its worst? constant Sleep (in general) Fair  Pain is worse with: some activites Pain improves with: medication Relief from Meds: 7  Mobility use a cane ability to climb steps?  yes do you drive?  yes Do you have any goals in this area?  yes  Function retired I need assistance with the following:  meal prep, household duties and shopping Do you have any goals in this area?  yes  Neuro/Psych weakness tremor confusion  Prior Studies Any changes since last visit?  yes x-rays CT/MRI  Physicians involved in your care Any changes since last visit?  yes Pacemaker,  cancer   Family History  Problem Relation Age of Onset  . Alzheimer's disease Mother   . Lung disease Mother   . Hypertension Father   . Heart disease Father    History   Social History  . Marital Status: Married    Spouse Name: N/A    Number of Children: N/A  . Years of Education: N/A   Occupational History  . retired    Social History Main Topics  . Smoking status: Never Smoker   . Smokeless tobacco: Never Used  . Alcohol Use: No  . Drug Use: No  . Sexually Active: None   Other Topics Concern  . None   Social History Narrative   Education college degree   Past Surgical History  Procedure Laterality Date  . Cholecystectomy  2007  . Appendectomy    . Abdominal hysterectomy      age 38  . Tonsillectomy    . Joint replacement      knee   . Lung lobectomy      1 &1/2 lobes right   Past Medical History  Diagnosis Date  . Depression   . IDDM (insulin dependent diabetes mellitus)   . Hypertension   . Osteoporosis   . Cancer     adenocarcinoma  lung  . Hyperlipidemia   . Renal insufficiency   . Sleep apnea     CPAP  . Atrial fibrillation  ABLATION AT [facility] 2000  . Ulcer     Gastric   BP 127/69  Pulse 90  Resp 16  Ht 5\' 3"  (1.6 m)  Wt 149 lb (67.586 kg)  BMI 26.4 kg/m2  SpO2 97%     Review of Systems  Respiratory: Positive for shortness of breath.   Cardiovascular: Positive for leg swelling.  Neurological: Positive for tremors and weakness.  Psychiatric/Behavioral: Positive for confusion.  All other systems reviewed and are negative.       Objective:   Physical Exam Constitutional: She is oriented to person, place, and time. She appears well-developed and well-nourished.  HENT:  Head: Normocephalic.  Neck: Neck supple.  Musculoskeletal: She exhibits tenderness.  Neurological: She is alert and oriented to person, place, and time.  Skin: Skin is warm and dry. 1 cm blood blister on her right toe, surrounded by redness and increased  temperature Psychiatric: She has a normal mood and affect.  Symmetric normal motor tone is noted throughout. Normal muscle bulk. Muscle testing reveals 5/5 muscle strength of the upper extremity, and 5/5 of the lower extremity. Full range of motion in upper and lower extremities, except left ankle dorsal extension is 90/0 degrees, plantar flex is 20 degrees. ROM of spine is restricted. Fine motor movements are normal in both hands.  DTR in the upper and lower extremity are present and symmetric 2+, except left patella reflex is 1+. No clonus is noted.  Patient arises from chair without difficulty. Narrow based gait with normal arm swing bilateral .  A sensory deficit (bilateral stocking glove sensory loss to knees) is present.         Assessment & Plan:  1. History of VATS for right lung cancer.  2. Chronic pain syndrome related to her chest wall, right knee and  left ankle areas. Today she is only complaining about her left ankle.  3. Intentional tremor right greater than left  4. Mild peripheral neuropathy  5. Medial ankle pain on the left,post-traumatic arthritis .  6. Hx of fall, recommended PT for stability, patient wants to try some exercises at home first. I showed her some exercises she could do in a safe way.Also recommended foot massage and foot bath with epson salt to train her nerve endings if possible.  7. Admitted to [facility] ED, was diagnosed with MI, per patient, do not have any reports. Patient is following up with cardiology.  A pacemaker was placed on her right chest on 12/17/12. She also reports that unfortunately her cancer has come back, she now has stage 4 cancer, cancerous cells where detected in the pleura. Her biopsy was sent to [facility], to find out whether a certain medication could treat her cancer , if so she states that she would maybe have 4-5 more years to live, otherwise 12-24 month.  PLAN:  1. Will continue with fentanyl patches at . A second script was  provided for next month.  Increased her Hydrocodone from 5mg  tid to 7.5 qid, prn pain. Oncologist called [place] and suggested an increase in her pain meds. Patient wanted to increase the Hydrocodone first, and if this is not sufficient then increase the Fentanyl if necessary, will talk to Dr. Riley Kill about this increase also. 2. Voltaren gel did not work. Patient can not afford the lidoderm patches, which have helped in the past.Recommended to patient to try Arnica cream for her joint and muscle pain.  3. Neoprene knee sleeve, did not give her any relief. She also needs  to look at wearing appropriate shoes.  4.back in 2 months, patient is 58, fragile, and has a long way to drive, will come earlier if necessary.

## 2012-12-31 NOTE — Patient Instructions (Signed)
Stay as active as tolerated. 

## 2013-01-01 IMAGING — CR DG CHEST 1V PORT
1 series · 1 of 1 positions shown · non-contrast
Comparison: 02/27/2011

CLINICAL DATA: Decreased oxygen saturations.  The patient had right
lung MASS removed earlier today.

PORTABLE CHEST - 1 VIEW

[AP]
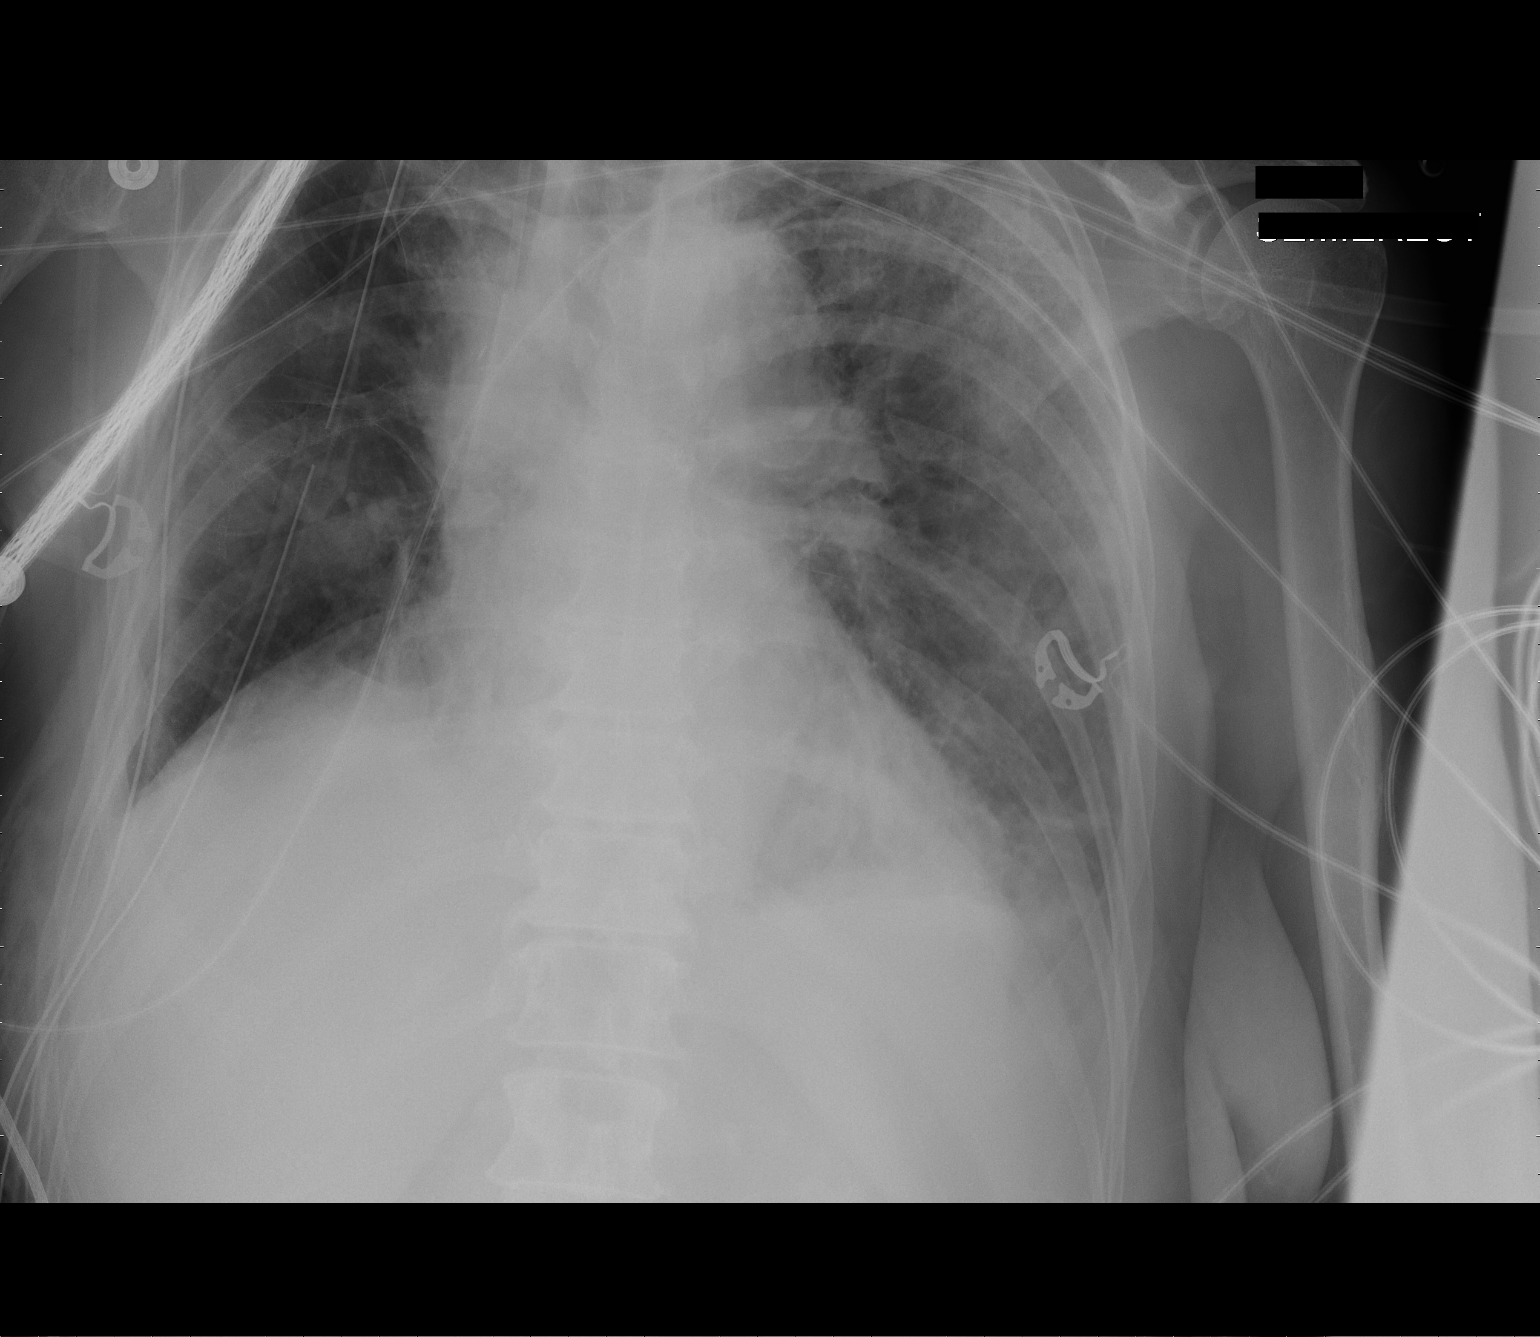

[1 of 1 positions shown; findings below may reference images not displayed]

FINDINGS: To right chest tubes.  Volume loss in the right
hemithorax consistent with postoperative change.  Surgical clips in
the right hilum with lung staples superiorly.  There does appear to
be a tiny right apical pneumothorax.  No significant pleural
effusion.  Right central venous catheter with tip in the SVC.
Heart size is borderline with some increase in the pulmonary
vascularity.  There is increasing diffuse opacity over the left
lung suggesting interstitial edema.  Small left pleural effusion is
not excluded.  This appears to be progressing since the previous
study.
IMPRESSION: Increased perihilar edema pattern particularly on the left,
suggesting fluid overload.  Possible small left pleural effusion.
Right hemithorax appears stable with two chest tubes in place and
tiny apical pneumothorax suggested.

## 2013-01-02 IMAGING — CR DG CHEST 1V PORT
1 series · 1 of 1 positions shown · non-contrast
Comparison: 02/28/2011

CLINICAL DATA: Status post vat's

PORTABLE CHEST - 1 VIEW

[AP]
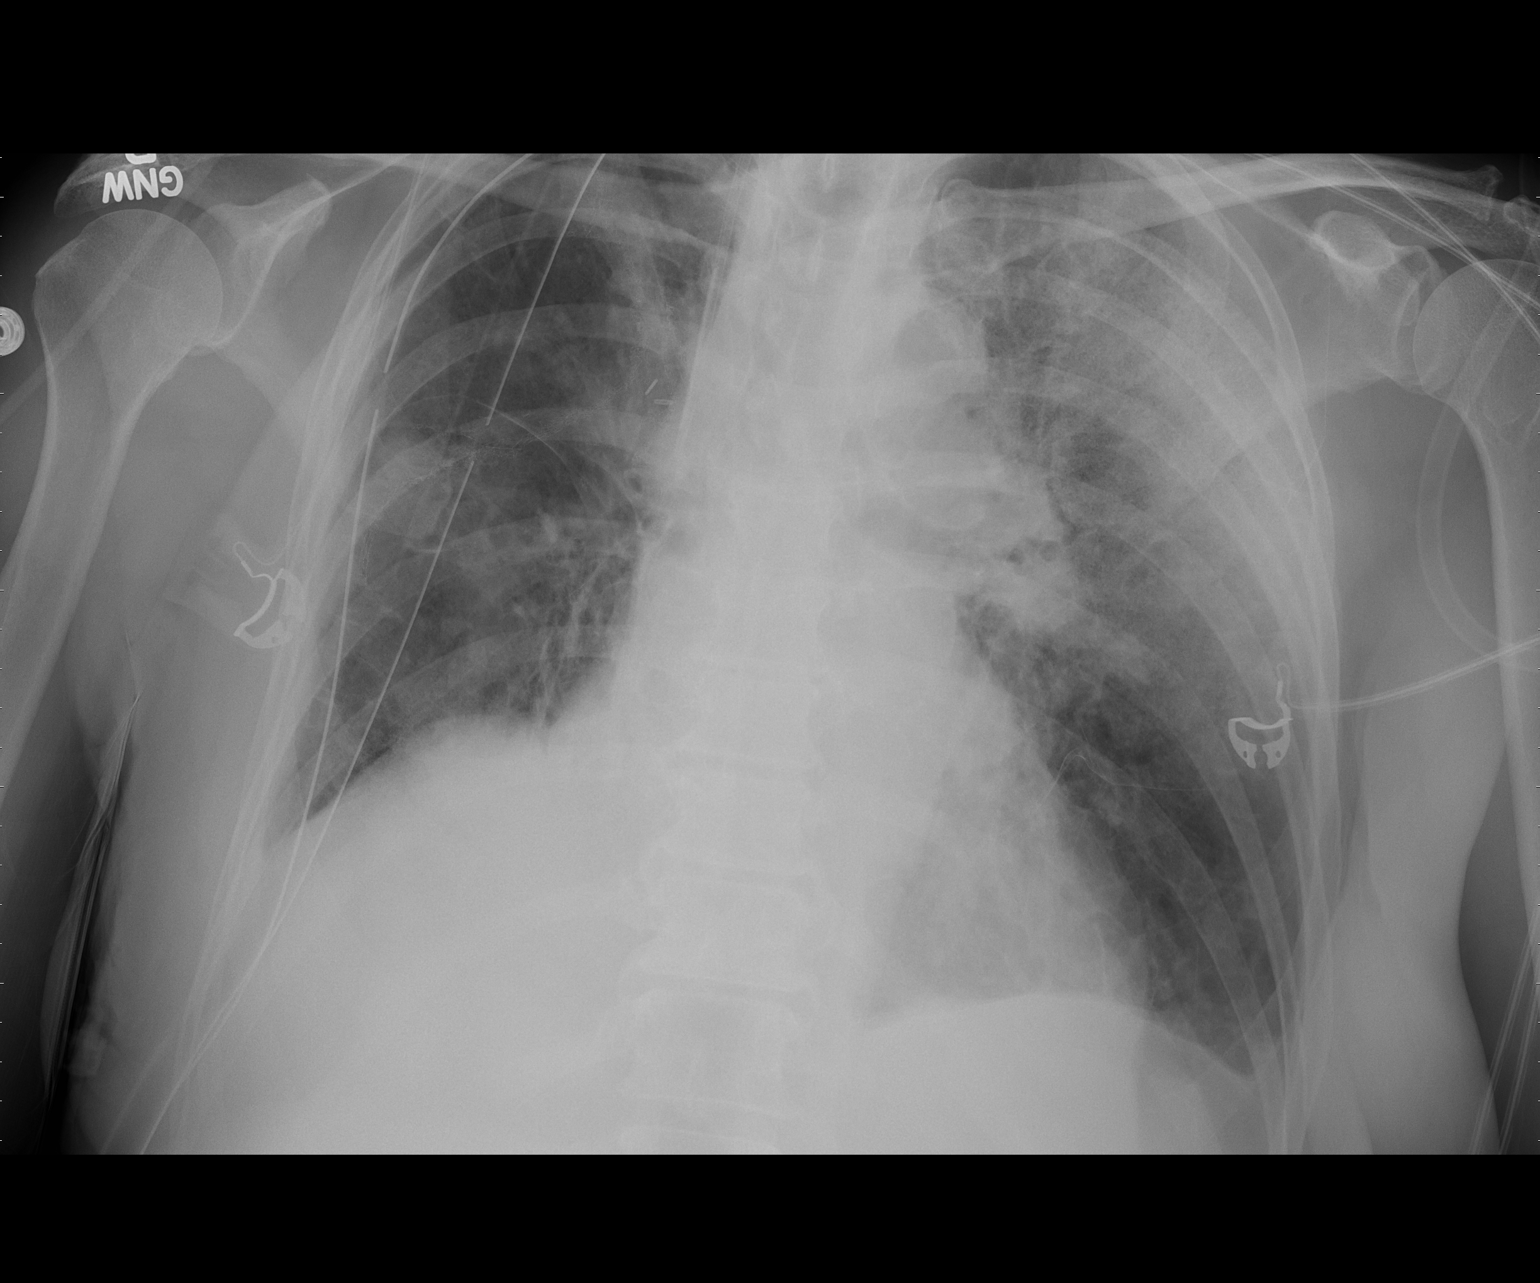

[1 of 1 positions shown; findings below may reference images not displayed]

FINDINGS: There is a right IJ catheter with tip in the SVC.

There are two right-sided chest tubes identified.  Small right
upper lobe pneumothorax is not significantly changed from previous
exam.

Left upper lobe airspace consolidation has increased in the
interval.
IMPRESSION: 1.  Stable right-sided chest tubes with small right apical
pneumothorax.
2.  Worsening aeration to the left upper lobe.

## 2013-01-04 ENCOUNTER — Telehealth: Payer: Self-pay

## 2013-01-04 NOTE — Telephone Encounter (Signed)
I just increased the dose of her Hydrocodone, she should try this increased dose for 2-3 days, if this is not sufficient, we can increase the fentanyl or switch her to Oxycodone, I also talked to Dr. Riley Kill about her, and he agrees to this plan

## 2013-01-04 NOTE — Telephone Encounter (Signed)
Patient called regarding her fentanyl patch.  When speaking with her she was a little confused as to why I had called her.  Reminded her that we would not increase fentanyl patch at this point.  She agreed as well as her other providers.  She says she is in terrible pain.  She is not sleeping at all.  It hurts worse when she lays down.  Suggested she try heat or icy hot, but patient wanted to know if there were any other options.

## 2013-01-08 IMAGING — CR DG CHEST 1V PORT
1 series · 1 of 1 positions shown · non-contrast
Comparison: 03/06/2011

CLINICAL DATA: Chest tube.  Postop VATS

PORTABLE CHEST - 1 VIEW

[view not recorded]
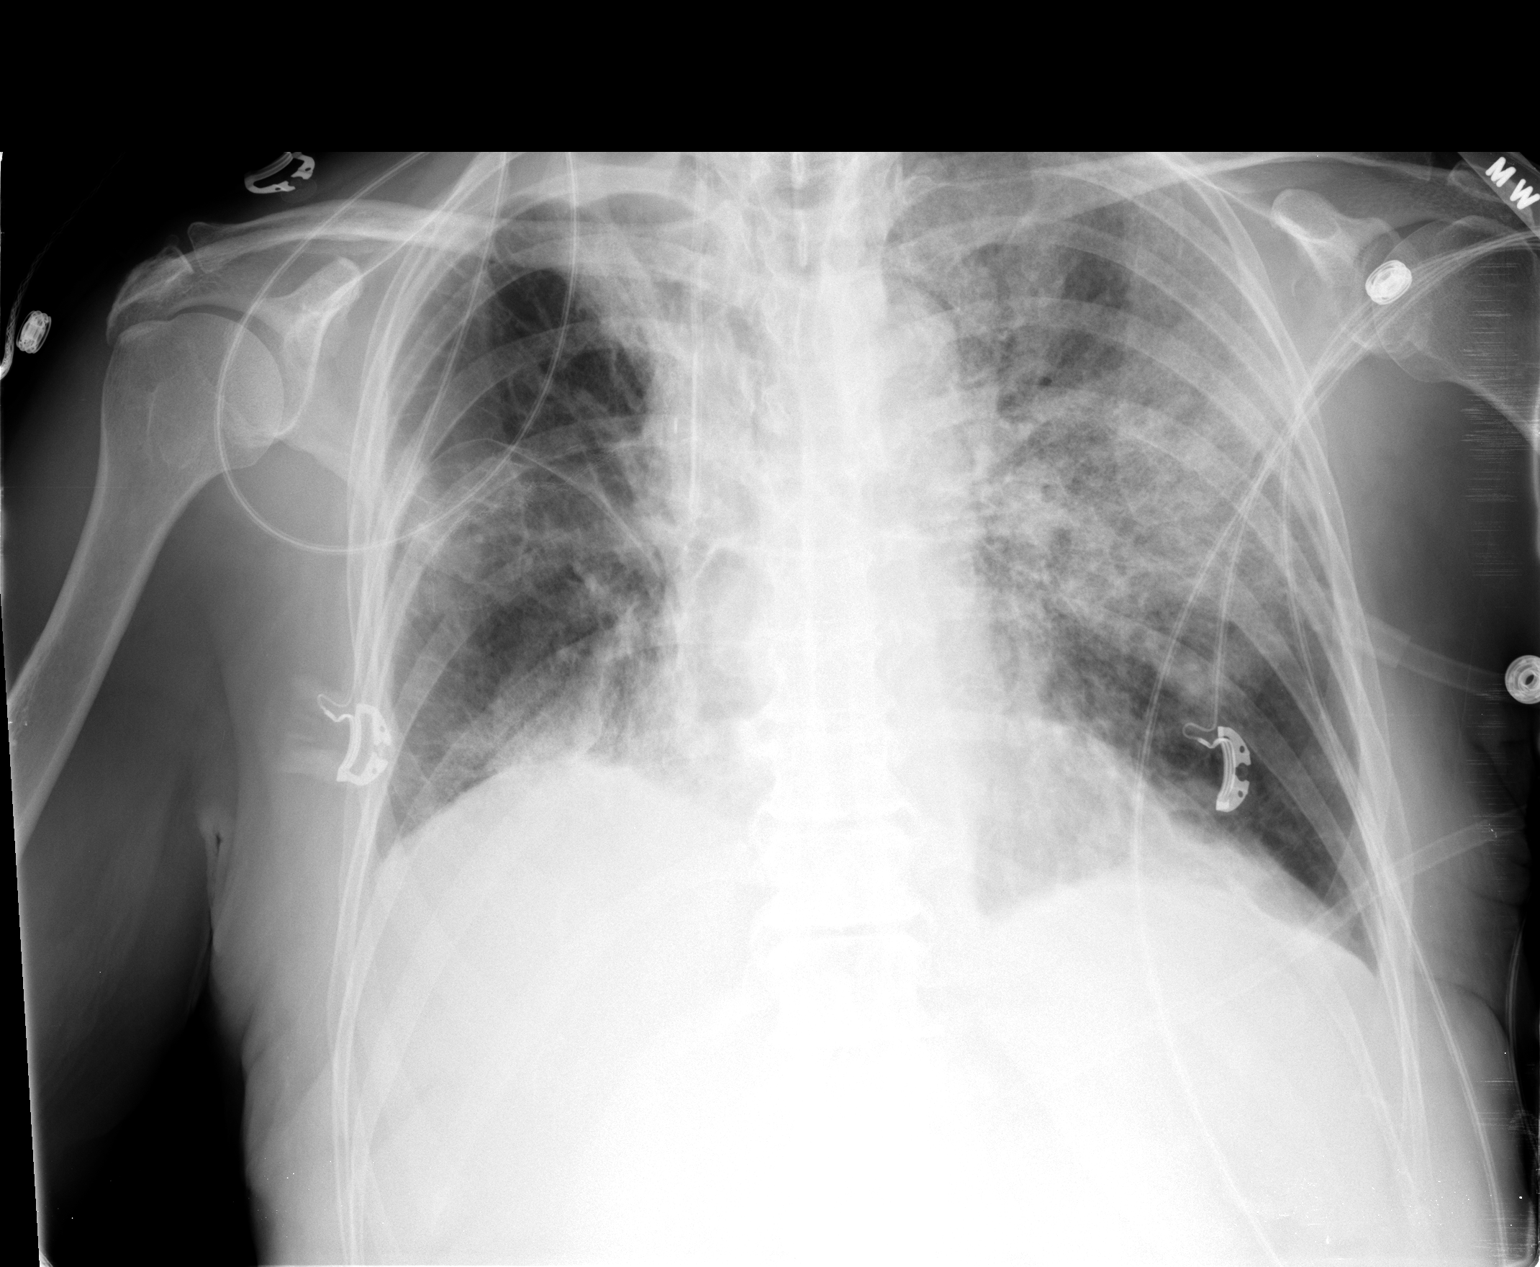

[1 of 1 positions shown; findings below may reference images not displayed]

FINDINGS: Left upper lobe infiltrate appears improved.  Normal
heart size.  Some venous catheter unchanged.  Right apical
effusion/hemothorax without visible pneumothorax.  Right basilar
infiltrate not excluded.
IMPRESSION: Improved left lung aeration.  No visible right pneumothorax.

## 2013-01-09 IMAGING — CR DG CHEST 1V PORT
1 series · 1 of 1 positions shown · non-contrast
Comparison: 03/07/2011

CLINICAL DATA: Chest tubes

PORTABLE CHEST - 1 VIEW

[AP]
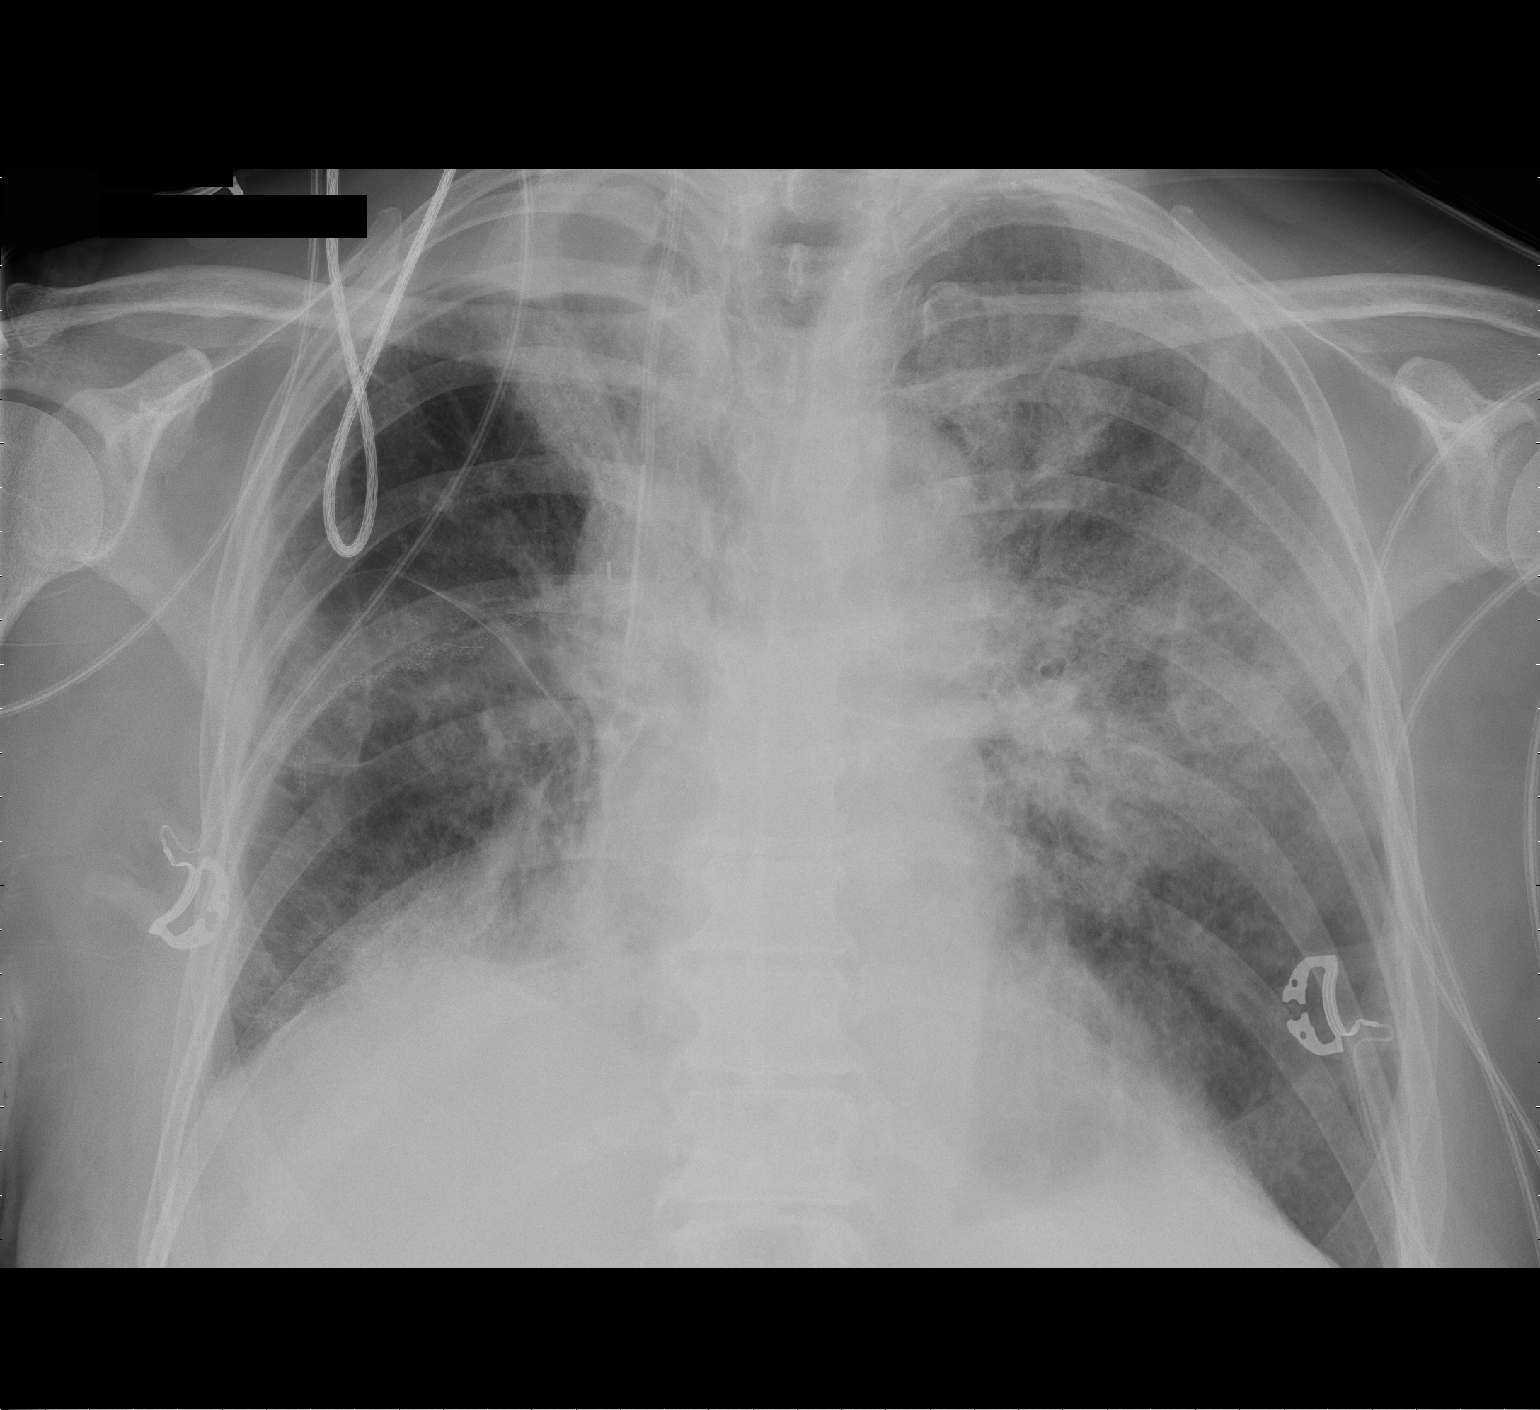

[1 of 1 positions shown; findings below may reference images not displayed]

FINDINGS: Stable patchy airspace opacities, most prominent the left
upper lung.  Surgical sutures in the right mid lung with associated
volume loss and stable right apical effusion.  No pneumothorax is
seen.

Cardiomediastinal silhouette is unchanged.

Stable right IJ venous catheter.
IMPRESSION: Postsurgical changes in the right lung with stable right apical
effusion.  No pneumothorax is seen.

Stable patchy bilateral airspace opacities.

## 2013-03-01 ENCOUNTER — Ambulatory Visit: Payer: Medicare Other | Admitting: Physical Medicine and Rehabilitation

## 2013-09-02 ENCOUNTER — Other Ambulatory Visit: Payer: Self-pay | Admitting: Orthopedic Surgery

## 2013-09-02 DIAGNOSIS — M5126 Other intervertebral disc displacement, lumbar region: Secondary | ICD-10-CM

## 2013-09-02 DIAGNOSIS — M4800 Spinal stenosis, site unspecified: Secondary | ICD-10-CM

## 2013-09-02 DIAGNOSIS — M25552 Pain in left hip: Secondary | ICD-10-CM

## 2013-09-09 ENCOUNTER — Ambulatory Visit
Admission: RE | Admit: 2013-09-09 | Discharge: 2013-09-09 | Disposition: A | Discharge status: Home / Self Care | Payer: Medicare Other | Source: Ambulatory Visit | Attending: Orthopedic Surgery | Admitting: Orthopedic Surgery

## 2013-09-09 VITALS — BP 99/55 | HR 88

## 2013-09-09 DIAGNOSIS — M5126 Other intervertebral disc displacement, lumbar region: Secondary | ICD-10-CM

## 2013-09-09 DIAGNOSIS — M25552 Pain in left hip: Secondary | ICD-10-CM

## 2013-09-09 DIAGNOSIS — M4800 Spinal stenosis, site unspecified: Secondary | ICD-10-CM

## 2013-09-09 MED ORDER — ONDANSETRON HCL 4 MG/2ML IJ SOLN
4.0000 mg | Freq: Once | INTRAMUSCULAR | Status: AC
  Administered 2013-09-09: 4 mg via INTRAMUSCULAR

## 2013-09-09 MED ORDER — MEPERIDINE HCL 100 MG/ML IJ SOLN
75.0000 mg | Freq: Once | INTRAMUSCULAR | Status: AC
  Administered 2013-09-09: 75 mg via INTRAMUSCULAR

## 2013-09-09 MED ORDER — IOHEXOL 180 MG/ML  SOLN
18.0000 mL | Freq: Once | INTRAMUSCULAR | Status: AC | PRN
  Administered 2013-09-09: 18 mL via INTRATHECAL

## 2013-09-09 NOTE — Discharge Instructions (Signed)
Myelogram Discharge Instructions  1. Go home and rest quietly for the next 24 hours.  It is important to lie flat for the next 24 hours.  Get up only to go to the restroom.  You may lie in the bed or on a couch on your back, your stomach, your left side or your right side.  You may have one pillow under your head.  You may have pillows between your knees while you are on your side or under your knees while you are on your back.  2. DO NOT drive today.  Recline the seat as far back as it will go, while still wearing your seat belt, on the way home.  3. You may get up to go to the bathroom as needed.  You may sit up for 10 minutes to eat.  You may resume your normal diet and medications unless otherwise indicated.  Drink lots of extra fluids today and tomorrow.  4. The incidence of headache, nausea, or vomiting is about 5% (one in 20 patients).  If you develop a headache, lie flat and drink plenty of fluids until the headache goes away.  Caffeinated beverages may be helpful.  If you develop severe nausea and vomiting or a headache that does not go away with flat bed rest, call [phone].  5. You may resume normal activities after your 24 hours of bed rest is over; however, do not exert yourself strongly or do any heavy lifting tomorrow. If when you get up you have a headache when standing, go back to bed and force fluids for another 24 hours.  6. Call your physician for a follow-up appointment.  The results of your myelogram will be sent directly to your physician by the following day.  7. If you have any questions or if complications develop after you arrive home, please call [phone].  Discharge instructions have been explained to the patient.  The patient, or the person responsible for the patient, fully understands these instructions.      May resume coumadin today.  May resume effexor on September 10, 2013, after 1:00 pm.

## 2013-09-09 NOTE — Progress Notes (Addendum)
Patient states she has been off Coumadin for four days, Effexor for the past two days and Lovenox since last night's injection.  Patient took own Xanax rather than "risking" our Valium.

## 2013-12-21 DEATH — deceased
